# Patient Record
Sex: Male | Born: 1990 | Marital: Single
Health system: Northeastern US, Academic
[De-identification: ages and names within clinical notes are randomized; demographics above are authoritative.]

---

## 2013-11-28 ENCOUNTER — Encounter (HOSPITAL_COMMUNITY): Payer: Self-pay

## 2013-11-28 ENCOUNTER — Emergency Department (HOSPITAL_COMMUNITY)
Admission: EM | Admit: 2013-11-28 | Discharge: 2013-11-29 | Disposition: A | Discharge status: Home / Self Care | Payer: BC Managed Care – PPO | Attending: Emergency Medicine | Admitting: Emergency Medicine

## 2013-11-28 ENCOUNTER — Emergency Department (HOSPITAL_COMMUNITY): Payer: BC Managed Care – PPO

## 2013-11-28 DIAGNOSIS — R21 Rash and other nonspecific skin eruption: Secondary | ICD-10-CM | POA: Insufficient documentation

## 2013-11-28 DIAGNOSIS — M791 Myalgia, unspecified site: Secondary | ICD-10-CM

## 2013-11-28 DIAGNOSIS — R509 Fever, unspecified: Secondary | ICD-10-CM

## 2013-11-28 DIAGNOSIS — R0981 Nasal congestion: Secondary | ICD-10-CM | POA: Insufficient documentation

## 2013-11-28 DIAGNOSIS — J029 Acute pharyngitis, unspecified: Secondary | ICD-10-CM | POA: Diagnosis not present

## 2013-11-28 LAB — COMPREHENSIVE METABOLIC PANEL
ALT: 19 U/L (ref 0–53)
ANION GAP: 20 — AB (ref 5–15)
AST: 25 U/L (ref 0–37)
Albumin: 3.6 g/dL (ref 3.5–5.2)
Alkaline Phosphatase: 79 U/L (ref 39–117)
BILIRUBIN TOTAL: 1.7 mg/dL — AB (ref 0.3–1.2)
BUN: 14 mg/dL (ref 6–23)
CHLORIDE: 93 meq/L — AB (ref 96–112)
CO2: 21 meq/L (ref 19–32)
Calcium: 9.1 mg/dL (ref 8.4–10.5)
Creatinine, Ser: 1.08 mg/dL (ref 0.50–1.35)
GFR calc Af Amer: >90 mL/min (ref >90)
Glucose, Bld: 104 mg/dL — ABNORMAL HIGH (ref 70–99)
POTASSIUM: 3.8 meq/L (ref 3.7–5.3)
Sodium: 134 mEq/L — ABNORMAL LOW (ref 137–147)
Total Protein: 7.9 g/dL (ref 6.0–8.3)

## 2013-11-28 LAB — URINALYSIS, ROUTINE W REFLEX MICROSCOPIC
Glucose, UA: NEGATIVE mg/dL
Hgb urine dipstick: NEGATIVE
KETONES UR: 40 mg/dL — AB
NITRITE: NEGATIVE
PH: 6 (ref 5.0–8.0)
PROTEIN: 30 mg/dL — AB
Specific Gravity, Urine: 1.035 — ABNORMAL HIGH (ref 1.005–1.030)
Urobilinogen, UA: >8 mg/dL — ABNORMAL HIGH (ref 0.0–1.0)

## 2013-11-28 LAB — CBC WITH DIFFERENTIAL/PLATELET
BASOS PCT: 0 % (ref 0–1)
Basophils Absolute: 0.1 10*3/uL (ref 0.0–0.1)
Eosinophils Absolute: 0.1 10*3/uL (ref 0.0–0.7)
Eosinophils Relative: 1 % (ref 0–5)
HEMATOCRIT: 43.4 % (ref 39.0–52.0)
Hemoglobin: 15.2 g/dL (ref 13.0–17.0)
LYMPHS ABS: 1.6 10*3/uL (ref 0.7–4.0)
LYMPHS PCT: 8 % — AB (ref 12–46)
MCH: 28.3 pg (ref 26.0–34.0)
MCHC: 35 g/dL (ref 30.0–36.0)
MCV: 80.8 fL (ref 78.0–100.0)
MONO ABS: 1.3 10*3/uL — AB (ref 0.1–1.0)
MONOS PCT: 7 % (ref 3–12)
NEUTROS ABS: 16.4 10*3/uL — AB (ref 1.7–7.7)
NEUTROS PCT: 84 % — AB (ref 43–77)
Platelets: 260 10*3/uL (ref 150–400)
RBC: 5.37 MIL/uL (ref 4.22–5.81)
RDW: 11.9 % (ref 11.5–15.5)
WBC: 19.4 10*3/uL — AB (ref 4.0–10.5)

## 2013-11-28 LAB — URINE MICROSCOPIC-ADD ON

## 2013-11-28 LAB — RAPID STREP SCREEN (MED CTR MEBANE ONLY): STREPTOCOCCUS, GROUP A SCREEN (DIRECT): NEGATIVE

## 2013-11-28 LAB — SEDIMENTATION RATE: Sed Rate: 34 mm/hr — ABNORMAL HIGH (ref 0–16)

## 2013-11-28 LAB — I-STAT CG4 LACTIC ACID, ED: Lactic Acid, Venous: 1.98 mmol/L (ref 0.5–2.2)

## 2013-11-28 LAB — CK: Total CK: 351 U/L — ABNORMAL HIGH (ref 7–232)

## 2013-11-28 MED ORDER — SODIUM CHLORIDE 0.9 % IV BOLUS (SEPSIS)
1000.0000 mL | Freq: Once | INTRAVENOUS | Status: AC
  Administered 2013-11-28: 1000 mL via INTRAVENOUS

## 2013-11-28 MED ORDER — DEXTROSE 5 % IV SOLN
1.0000 g | Freq: Once | INTRAVENOUS | Status: AC
  Administered 2013-11-29: 1 g via INTRAVENOUS
  Filled 2013-11-28: qty 10

## 2013-11-28 MED ORDER — IBUPROFEN 800 MG PO TABS
800.0000 mg | ORAL_TABLET | Freq: Once | ORAL | Status: AC
  Administered 2013-11-28: 800 mg via ORAL
  Filled 2013-11-28: qty 1

## 2013-11-28 NOTE — ED Notes (Signed)
3rd fluid bolus started

## 2013-11-28 NOTE — ED Provider Notes (Signed)
CSN: [id number]     Arrival date & time 11/28/13  1911 History   First MD Initiated Contact with Patient 11/28/13 1950     Chief Complaint  Patient presents with  . Fever  . Arm Pain  . Leg Pain     (Consider location/radiation/quality/duration/timing/severity/associated sxs/prior Treatment) HPI Comments: Patient from [facility], no significant PMH -- presents with 3 days of fever into the 103F-104F range, nasal congestion, sore throat, severe myalgias in bilateral arms and bilateral legs. [facility] and referred to the emergency department. He is given Tylenol prior to arrival. Patient states no other treatments for his symptoms. Patient states that he had a headache at the onset of his symptoms on Saturday (3 days ago) but this is largely resolved. No photophobia or phonophobia. He has not had eye redness, runny nose, or cough. He has not had neck stiffness or neck pain. He has not had nausea, vomiting but has had a decreased appetite. Patient reports watery stools without blood over the past 24 hours. No urinary symptoms. Patient states that his hands appear red but denies other rash. No known sick contacts.   Patient is a 23 y.o. male presenting with fever, arm pain, and leg pain. The history is provided by the patient.  Fever Associated symptoms: chills, congestion, myalgias, rash (red hands) and sore throat   Associated symptoms: no chest pain, no confusion, no cough, no diarrhea, no dysuria, no ear pain, no headaches (resolved), no nausea, no rhinorrhea and no vomiting   Arm Pain Associated symptoms include chills, congestion, fatigue, a fever, myalgias, a rash (red hands) and a sore throat. Pertinent negatives include no abdominal pain, arthralgias, chest pain, coughing, headaches (resolved), joint swelling, nausea, neck pain, vomiting or weakness.  Leg Pain Associated symptoms: fatigue and fever   Associated symptoms: no back pain and no neck pain      History reviewed. No pertinent past medical history. History reviewed. No pertinent past surgical history. History reviewed. No pertinent family history. History  Substance Use Topics  . Smoking status: Passive Smoke Exposure - Never Smoker  . Smokeless tobacco: Not on file  . Alcohol Use: No    Review of Systems  Constitutional: Positive for fever, chills, appetite change and fatigue.  HENT: Positive for congestion and sore throat. Negative for ear discharge, ear pain, facial swelling, mouth sores and rhinorrhea.   Eyes: Negative for redness.  Respiratory: Negative for cough and shortness of breath.   Cardiovascular: Negative for chest pain.  Gastrointestinal: Negative for nausea, vomiting, abdominal pain and diarrhea.  Genitourinary: Negative for dysuria, frequency and hematuria.  Musculoskeletal: Positive for myalgias. Negative for back pain, joint swelling, arthralgias, neck pain and neck stiffness.  Skin: Positive for rash (red hands).  Neurological: Negative for dizziness, seizures, syncope, speech difficulty, weakness, light-headedness and headaches (resolved).  Psychiatric/Behavioral: Negative for confusion.      Allergies  Review of patient's allergies indicates no known allergies.  Home Medications   Prior to Admission medications   Not on File   There were no vitals taken for this visit. Physical Exam  Constitutional: He appears well-developed and well-nourished.  HENT:  Head: Normocephalic and atraumatic.  Right Ear: Tympanic membrane, external ear and ear canal normal.  Left Ear: Tympanic membrane, external ear and ear canal normal.  Nose: No mucosal edema or rhinorrhea.  Mouth/Throat: Posterior oropharyngeal erythema present. No oropharyngeal exudate, posterior oropharyngeal edema or tonsillar abscesses.  Eyes: Right eye exhibits  no discharge. Left eye exhibits no discharge.  Mild scleral injection, L>R  Neck: Trachea normal and normal range of motion.  Neck supple. No spinous process tenderness and no muscular tenderness present. No rigidity. Normal range of motion present. No Brudzinski's sign and no Kernig's sign noted.  No meningeal signs.  Cardiovascular: Regular rhythm and normal heart sounds.   No murmur heard. Mild tachycardia.  Pulmonary/Chest: Effort normal and breath sounds normal. No respiratory distress. He has no wheezes. He has no rales.  Abdominal: Soft. There is no tenderness. There is no rebound and no guarding.  Musculoskeletal: He exhibits tenderness (generalized extremity tenderness). He exhibits no edema.  Neurological: He is alert. No cranial nerve deficit. Coordination normal.  Patient with 5/5 strength of hands, wrists, elbows, shoulders bilaterally.   Patient with 5/5 strength ankles, knees, hips bilaterally.   He reports muscle pain with movement of his arms and legs.   Skin: Skin is warm and dry.  No distinctive rash on palms, soles, nailbeds.   Psychiatric: He has a normal mood and affect.  Nursing note and vitals reviewed.   ED Course  Procedures (including critical care time) Labs Review Labs Reviewed  CBC WITH DIFFERENTIAL - Abnormal; Notable for the following:    WBC 19.4 (*)    Neutrophils Relative % 84 (*)    Neutro Abs 16.4 (*)    Lymphocytes Relative 8 (*)    Monocytes Absolute 1.3 (*)    All other components within normal limits  COMPREHENSIVE METABOLIC PANEL - Abnormal; Notable for the following:    Sodium 134 (*)    Chloride 93 (*)    Glucose, Bld 104 (*)    Total Bilirubin 1.7 (*)    Anion gap 20 (*)    All other components within normal limits  URINALYSIS, ROUTINE W REFLEX MICROSCOPIC - Abnormal; Notable for the following:    Color, Urine AMBER (*)    Specific Gravity, Urine 1.035 (*)    Bilirubin Urine MODERATE (*)    Ketones, ur 40 (*)    Protein, ur 30 (*)    Urobilinogen, UA >8.0 (*)    Leukocytes, UA SMALL (*)    All other components within normal limits  CK - Abnormal;  Notable for the following:    Total CK 351 (*)    All other components within normal limits  SEDIMENTATION RATE - Abnormal; Notable for the following:    Sed Rate 34 (*)    All other components within normal limits  RAPID STREP SCREEN  URINE CULTURE  CULTURE, BLOOD (ROUTINE X 2)  CULTURE, BLOOD (ROUTINE X 2)  CULTURE, GROUP A STREP  URINE MICROSCOPIC-ADD ON  I-STAT CG4 LACTIC ACID, ED     Imaging Review Dg Chest 2 View  11/28/2013   CLINICAL DATA:  Fever, body aches.  EXAM: CHEST  2 VIEW  COMPARISON:  None.  FINDINGS: The heart size and mediastinal contours are within normal limits. Both lungs are clear. The visualized skeletal structures are unremarkable.  IMPRESSION: No active cardiopulmonary disease.   Electronically Signed   By: Annia Beltrew  Davis M.D.   On: 11/28/2013 22:58     EKG Interpretation None       8:08 PM Patient seen and examined. Work-up initiated. Medications ordered.   Vital signs reviewed and are as follows: BP 131/73 mmHg  Pulse 106  Temp(Src) 98.8 F (37.1 C) (Oral)  Resp 20  SpO2 100%  11:56 PM Patient was discussed previously with Dr. Donnald GarrePfeiffer who  has seen patient. CXR was ordered and is negative. Patient appears very well and will go home. Prior to discharge patient to be given 1g IV Rocephin.   MDM   Final diagnoses:  None   Patient with fever. Patient appears well, non-toxic, tolerating PO's. This is likely a viral illness -- but 1g given to treat for strep given sore throat in case strep test is false neg. Throat is fairly erythematous without sign of abscess.   Do not suspect otitis media as TM's appear normal.  Do not suspect PNA given clear lung sounds on exam, patient with no cough, negative CXR.  Do not suspect strep throat negative strep screen, but throat is erythematous. Do not suspect UTI given normal UA.  Do not suspect meningitis given no HA, meningeal signs on exam.  Do not suspect endo/peri/myocarditis given no risk factors, no  CP/SOB, no skin findings.  Do not suspect abdominal etiology given no abd pain on exam.   Supportive care indicated with pediatrician follow-up or return if worsening. No dangerous or life-threatening conditions suspected or identified by history, physical exam, and by work-up. No indications for hospitalization identified.       Mitchelle Goerner, PA-C 11/28/13 2359

## 2013-11-28 NOTE — ED Notes (Signed)
Pt presents with a fever, BUE and BLE pain and weakness. Pt seen [facility] at his [facility]. States he can move his limbs but with "great pain."

## 2013-11-28 NOTE — ED Notes (Signed)
Pt's fever here was 98.8 here but was recorded on his paper work that was sent from [facility] was 103.5. Pt denies receiving anything for a fever

## 2013-11-28 NOTE — ED Notes (Signed)
Josh, PA-C, at the bedside.  

## 2013-11-29 LAB — URINE CULTURE
COLONY COUNT: NO GROWTH
Culture: NO GROWTH
SPECIAL REQUESTS: NORMAL

## 2013-11-29 MED ORDER — ACETAMINOPHEN 500 MG PO TABS: 1000.0000 mg | ORAL_TABLET | Freq: Four times a day (QID) | ORAL | Status: AC | PRN

## 2013-11-29 MED ORDER — IBUPROFEN 600 MG PO TABS
600.0000 mg | ORAL_TABLET | Freq: Four times a day (QID) | ORAL | Status: AC | PRN
Start: 2013-11-29 — End: ?

## 2013-11-29 NOTE — Discharge Instructions (Signed)
Fever, Adult A fever is a higher than normal body temperature. In an adult, an oral temperature around 98.6 F (37 C) is considered normal. A temperature of 100.4 F (38 C) or higher is generally considered a fever. Mild or moderate fevers generally have no long-term effects and often do not require treatment. Extreme fever (greater than or equal to 106 F or 41.1 C) can cause seizures. The sweating that may occur with repeated or prolonged fever may cause dehydration. Elderly people can develop confusion during a fever. A measured temperature can vary with:  Age.  Time of day. Method of measurement (mouth, underarm, rectal, or ear). Muscle Pain Muscle pain (myalgia) may be caused by many things, including: Overuse or muscle strain, especially if you are not in shape. This is the most common cause of muscle pain. Injury. Bruises. Viruses, such as the flu. Infectious diseases. Fibromyalgia, which is a chronic condition that causes muscle tenderness, fatigue, and headache. Autoimmune diseases, including lupus. Certain drugs, including ACE inhibitors and statins. Muscle pain may be mild or severe. In most cases, the pain lasts only a short time and goes away without treatment. To diagnose the cause of your muscle pain, your health care provider will take your medical history. This means he or she will ask you when your muscle pain began and what has been happening. If you have not had muscle pain for very long, your health care provider may want to wait before doing much testing. If your muscle pain has lasted a long time, your health care provider may want to run tests right away. If your health care provider thinks your muscle pain may be caused by illness, you may need to have additional tests to rule out certain conditions.  Treatment for muscle pain depends on the cause. Home care is often enough to relieve muscle pain. Your health care provider may also prescribe anti-inflammatory  medicine. HOME CARE INSTRUCTIONS Watch your condition for any changes. The following actions may help to lessen any discomfort you are feeling: Only take over-the-counter or prescription medicines as directed by your health care provider. Apply ice to the sore muscle: Put ice in a plastic bag. Place a towel between your skin and the bag. Leave the ice on for 15-20 minutes, 3-4 times a day. You may alternate applying hot and cold packs to the muscle as directed by your health care provider. If overuse is causing your muscle pain, slow down your activities until the pain goes away. Remember that it is normal to feel some muscle pain after starting a workout program. Muscles that have not been used often will be sore at first. Do regular, gentle exercises if you are not usually active. Warm up before exercising to lower your risk of muscle pain. Do not continue working out if the pain is very bad. Bad pain could mean you have injured a muscle. SEEK MEDICAL CARE IF: Your muscle pain gets worse, and medicines do not help. You have muscle pain that lasts longer than 3 days. You have a rash or fever along with muscle pain. You have muscle pain after a tick bite. You have muscle pain while working out, even though you are in good physical condition. You have redness, soreness, or swelling along with muscle pain. You have muscle pain after starting a new medicine or changing the dose of a medicine. SEEK IMMEDIATE MEDICAL CARE IF: You have trouble breathing. You have trouble swallowing. You have muscle pain along with a stiff  neck, fever, and vomiting. You have severe muscle weakness or cannot move part of your body. MAKE SURE YOU:  Understand these instructions. Will watch your condition. Will get help right away if you are not doing well or get worse. Document Released: 12/04/2005 Document Revised: 01/17/2013 Document Reviewed: 11/08/2012 [facility] Patient Information 2015 [place].  This information is not intended to replace advice given to you by your health care provider. Make sure you discuss any questions you have with your health care provider.   The fever is confirmed by taking a temperature with a thermometer. Temperatures can be taken different ways. Some methods are accurate and some are not.  An oral temperature is used most commonly. Electronic thermometers are fast and accurate.  An ear temperature will only be accurate if the thermometer is positioned as recommended by the manufacturer.  A rectal temperature is accurate and done for those adults who have a condition where an oral temperature cannot be taken.  An underarm (axillary) temperature is not accurate and not recommended. Fever is a symptom, not a disease.  CAUSES   Infections commonly cause fever.  Some noninfectious causes for fever include:  Some arthritis conditions.  Some thyroid or adrenal gland conditions.  Some immune system conditions.  Some types of cancer.  A medicine reaction.  High doses of certain street drugs such as methamphetamine.  Dehydration.  Exposure to high outside or room temperatures.  Occasionally, the source of a fever cannot be determined. This is sometimes called a "fever of unknown origin" (FUO).  Some situations may lead to a temporary rise in body temperature that may go away on its own. Examples are:  Childbirth.  Surgery.  Intense exercise. HOME CARE INSTRUCTIONS   Take appropriate medicines for fever. Follow dosing instructions carefully. If you use acetaminophen to reduce the fever, be careful to avoid taking other medicines that also contain acetaminophen. Do not take aspirin for a fever if you are younger than age 21. There is an association with Reye's syndrome. Reye's syndrome is a rare but potentially deadly disease.  If an infection is present and antibiotics have been prescribed, take them as directed. Finish them even if you start  to feel better.  Rest as needed.  Maintain an adequate fluid intake. To prevent dehydration during an illness with prolonged or recurrent fever, you may need to drink extra fluid.Drink enough fluids to keep your urine clear or pale yellow.  Sponging or bathing with room temperature water may help reduce body temperature. Do not use ice water or alcohol sponge baths.  Dress comfortably, but do not over-bundle. SEEK MEDICAL CARE IF:   You are unable to keep fluids down.  You develop vomiting or diarrhea.  You are not feeling at least partly better after 3 days.  You develop new symptoms or problems. SEEK IMMEDIATE MEDICAL CARE IF:   You have shortness of breath or trouble breathing.  You develop excessive weakness.  You are dizzy or you faint.  You are extremely thirsty or you are making little or no urine.  You develop new pain that was not there before (such as in the head, neck, chest, back, or abdomen).  You have persistent vomiting and diarrhea for more than 1 to 2 days.  You develop a stiff neck or your eyes become sensitive to light.  You develop a skin rash.  You have a fever or persistent symptoms for more than 2 to 3 days.  You have a fever and  your symptoms suddenly get worse. MAKE SURE YOU:   Understand these instructions.  Will watch your condition.  Will get help right away if you are not doing well or get worse. Document Released: 07/08/2000 Document Revised: 05/29/2013 Document Reviewed: 11/13/2010 [facility] Patient Information 2015 [place], [place]. This information is not intended to replace advice given to you by your health care provider. Make sure you discuss any questions you have with your health care provider.

## 2013-11-30 LAB — CULTURE, GROUP A STREP

## 2013-12-05 LAB — CULTURE, BLOOD (ROUTINE X 2)
Culture: NO GROWTH
Culture: NO GROWTH

## 2015-08-30 IMAGING — CR DG CHEST 2V
2 series · 2 of 2 positions shown · non-contrast
Comparison: None.

CLINICAL DATA: Fever, body aches.

EXAM:
CHEST  2 VIEW

[w chest pa]
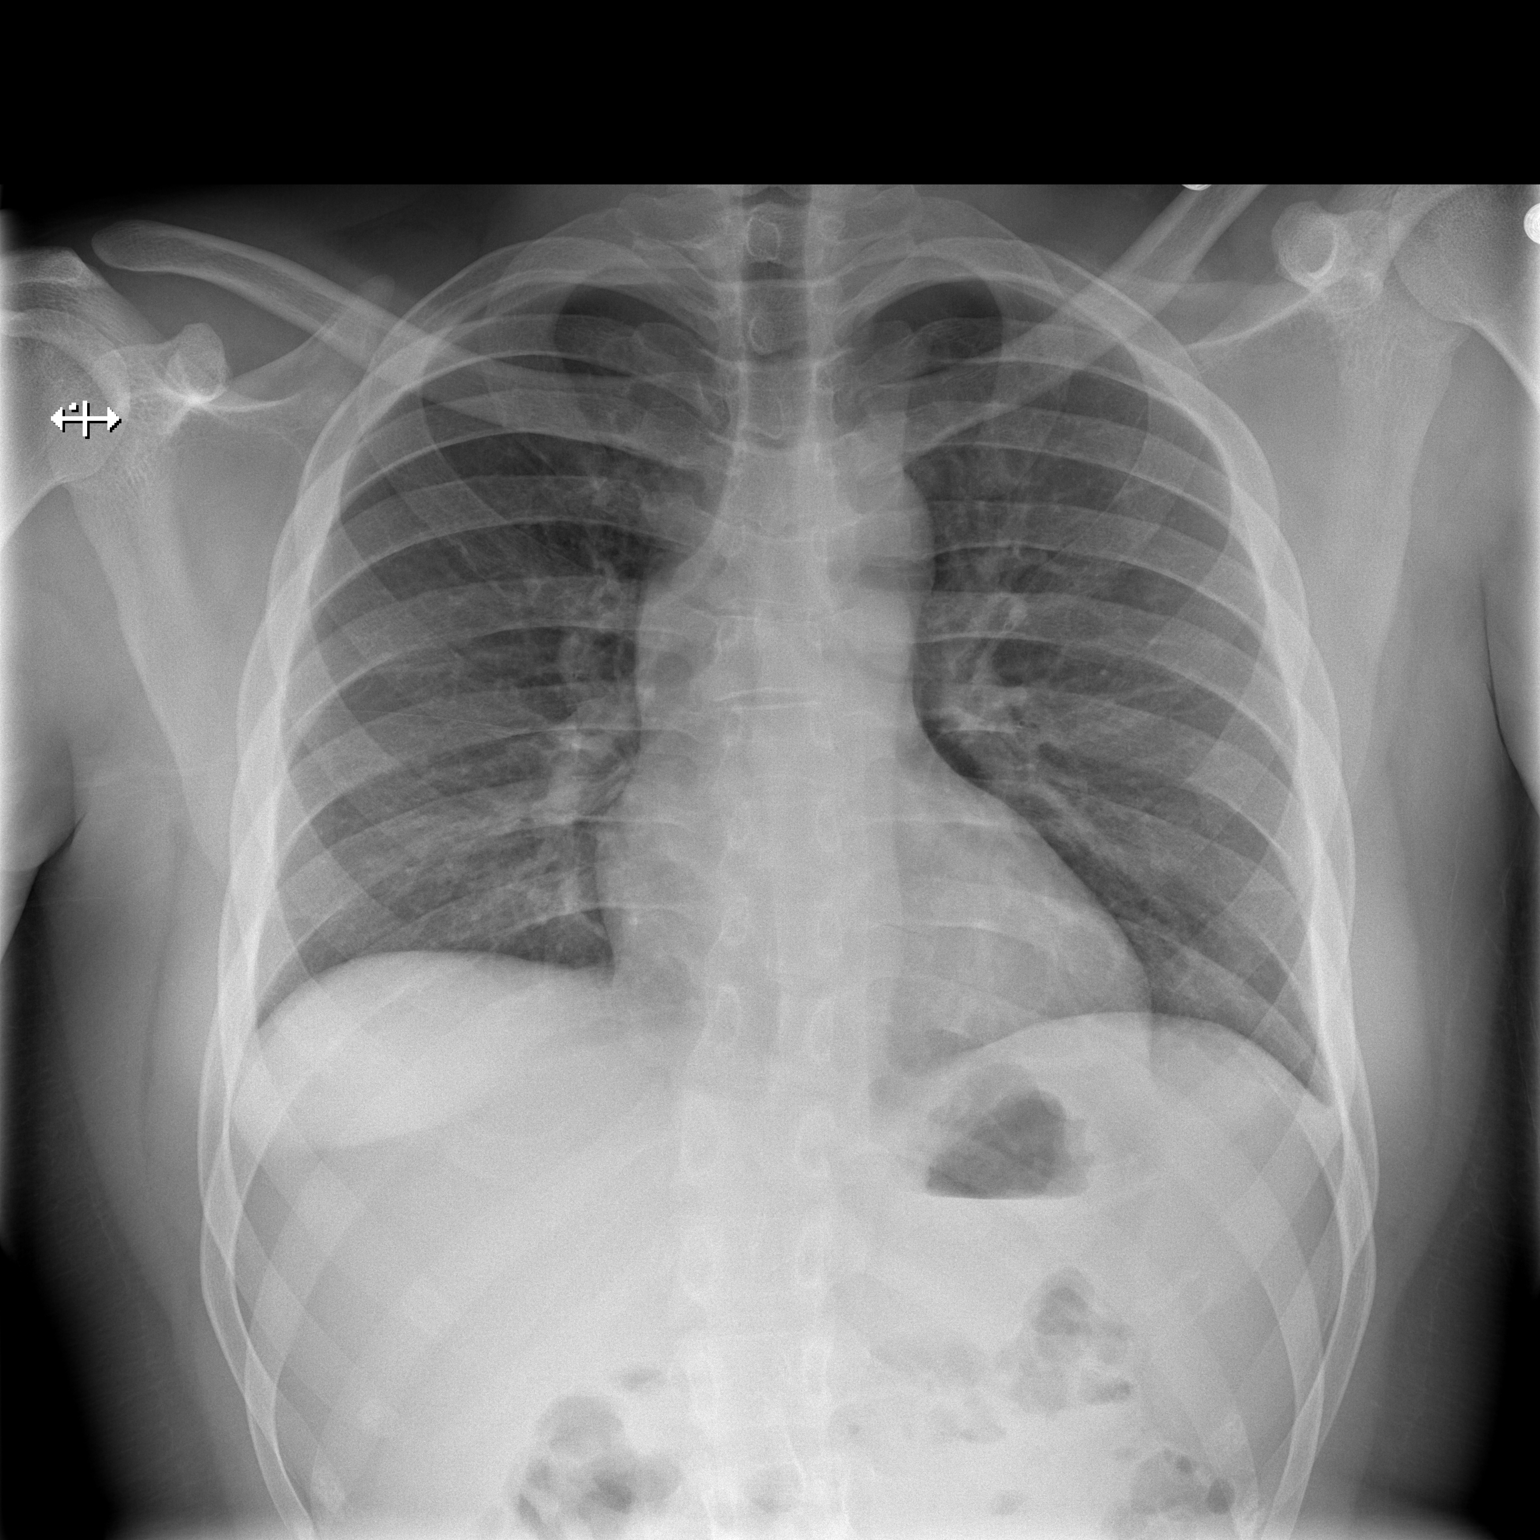

[w chest lat]
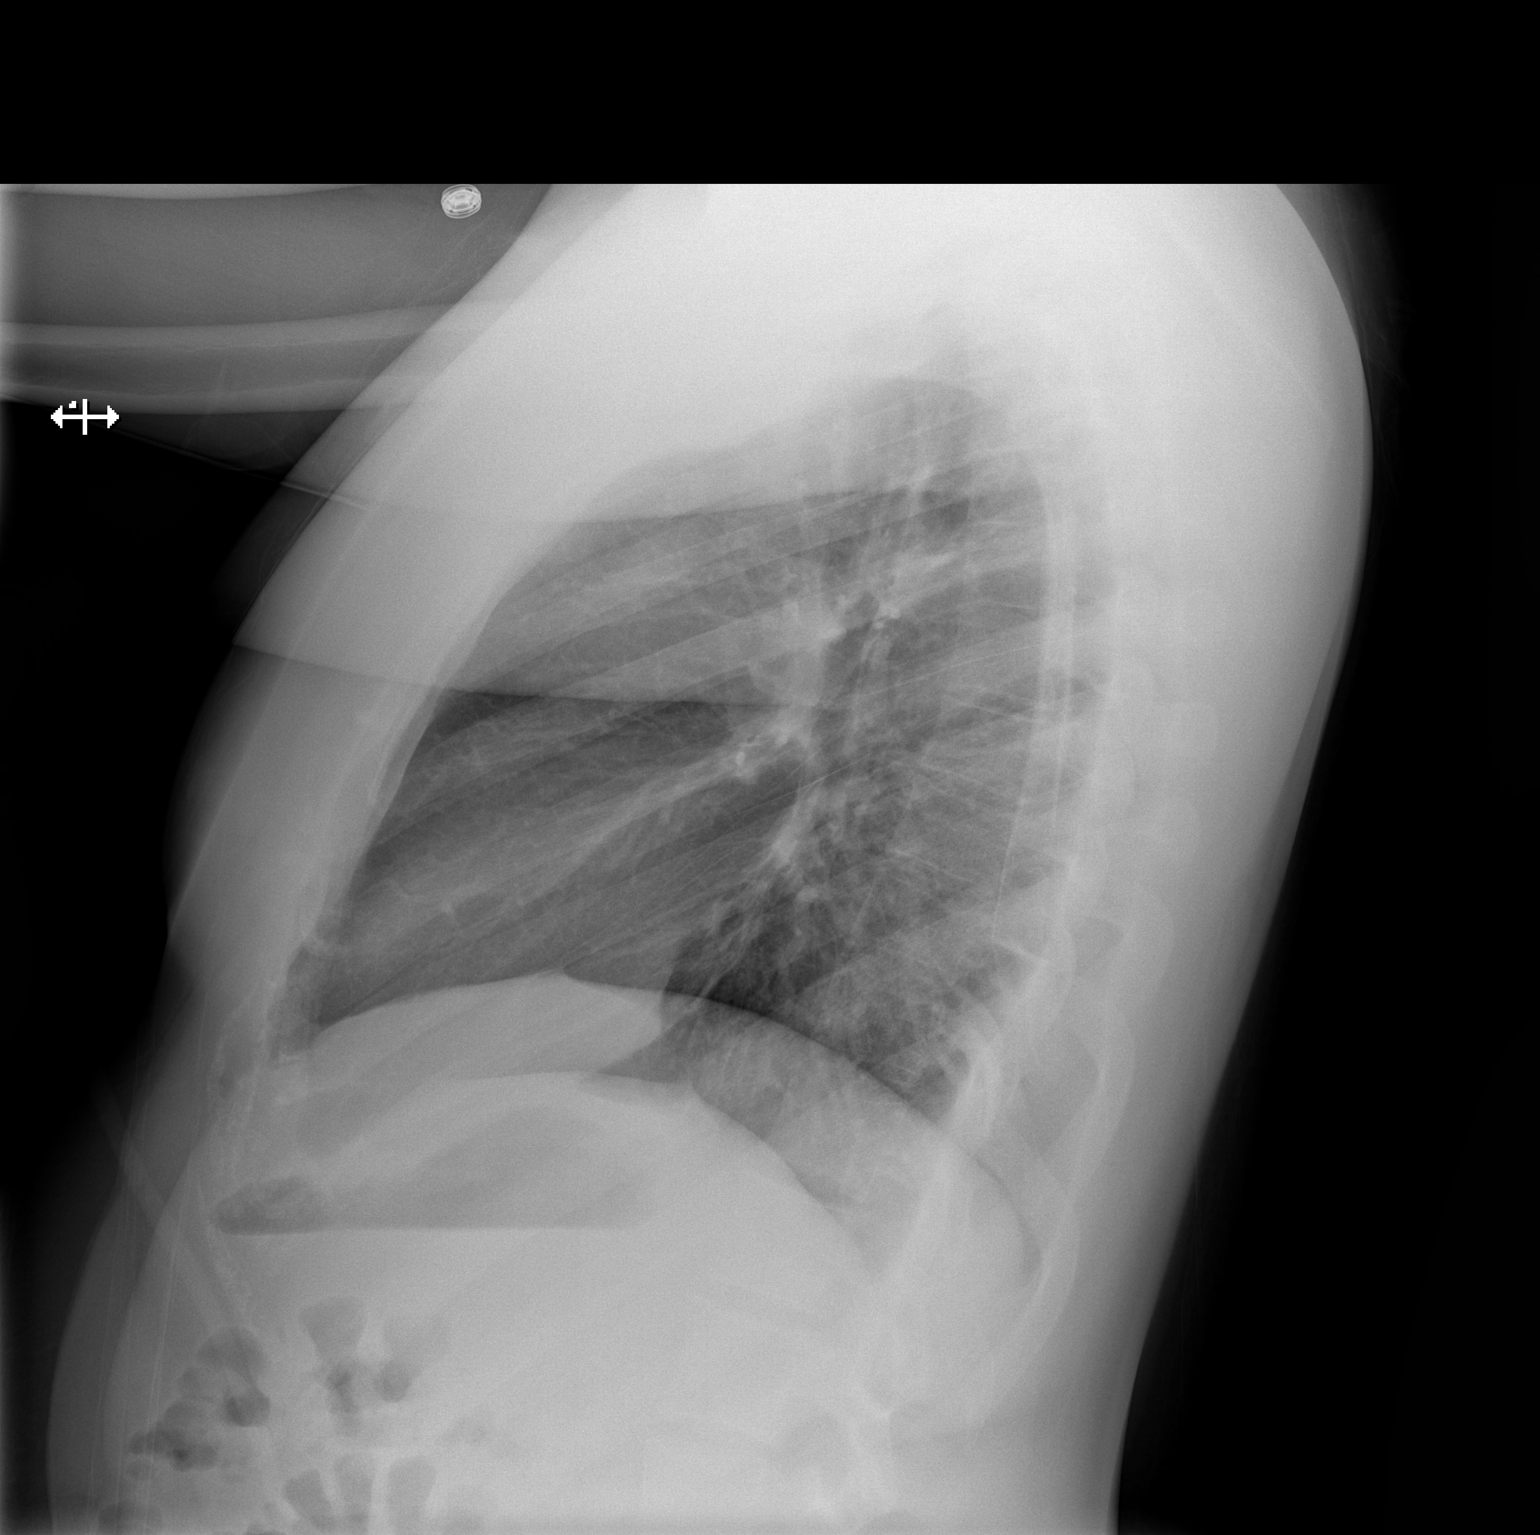

[2 of 2 positions shown; findings below may reference images not displayed]

FINDINGS: The heart size and mediastinal contours are within normal limits.
Both lungs are clear. The visualized skeletal structures are
unremarkable.
IMPRESSION: No active cardiopulmonary disease.

## 2017-01-15 ENCOUNTER — Inpatient Hospital Stay: Admission: RE | Admit: 2017-01-15 | Payer: Self-pay | Source: Ambulatory Visit

## 2017-03-26 NOTE — Anesthesia Preprocedure Evaluation (Addendum)
Anesthesia Pre-operative History and Physical for Francisco Donaldson    Highlighted Issues for this Procedure:  27 y.o. 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    Allergies:  No Known Allergies (drug, envir, food or latex)    Habitus:  - There is no height or weight on file to calculate BMI --> PCP note Jan 2019 shows BMI 28.05, pt is 6'0", 93.8kg    PMH:  - no contributing pmhx  - pt had negative cardiac work up per Feb 2019 cardiology note. Was worked up following illness following trip to [place] and subsequent imaging.  Cardiology notes ECHO to be normal with EF 55% and NO LVH and nml ECG.     Anesthetic history:  1).  No previous anesthetic records were available for comparison. No surgical hx reported         Note: I received verbal report from Dr. Clovis Riley, and I have reviewed this document. -Dr. Evonnie Dawes  CPM Summary:    I have reviewed the RN anesthesia screening information and erecords for [facility]. The patient is scheduled for LEFORT ORIF, Lefort I maxillary osteotomy (N/A Mouth)MANDIBLE RECONSTRUCTION, bilateral sagittal split osteotomies mandible (Bilateral Mouth)   GENIOPLASTY on 04/02/17.    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 No additional cardiopulmonary testing or in person anesthesia evaluation is indicated for this procedure.  CPM Chart Review onlyBy Jannetta Quint, NP at 10:17 AM on 03/26/2017    Anesthesia Evaluation Information Source: records, patient         GENERAL  Pertinent (-):  No obesity    HEENT    + Visual Impairment          corrective lens for ADL    + TMJD  Pertinent (-):  No  anatomic issues, sinus issues or nosebleeds PULMONARY     Denies pulmonary issues    CARDIOVASCULAR  Poor<4 METS Exercise Tolerance    + Cardiac Testing          more details above, 55% ejection fraction    GI/HEPATIC/RENAL  Last PO Intake: >8hr before procedure and >2hr before procedure (clears)  Pertinent(-):  No liver  issues or renal issues NEURO/PSYCH     Denies neuro/psych issues    ENDO/OTHER  Pertinent(-):  No diabetes mellitus, thyroid disease    HEMATOLOGIC     Denies hematologic issues       Physical Exam    Airway            Mouth opening: normal            Mallampati: II            TM distance (fb): >3 FB            TM distance (cm): 3            Neck ROM: full  Dental        Cardiovascular           Rhythm: regular           Rate: normal  Vascular Access            > 1 PIV  Pulmonary     breath sounds clear to auscultation    Mental Status     oriented to person, place and time         ________________________________________________________________________  PLAN  ASA Score  1  Anesthetic Plan general     Induction (routine IV) General Anesthesia/Sedation Maintenance Plan (inhaled agents);  Airway Manipulation (video laryngoscope); Airway (nasal RAE ETT); Line ( use current access); Monitoring (standard ASA); Positioning (supine); PONV Plan (dexamethasone and ondansetron); Pain (per surgical team); PostOp (PACU)    Informed Consent     Risks:          Risks discussed were commensurate with the plan listed above with the following specific points: N/V, aspiration, sore throat, hypotension, emergence delirium, dizziness, unsteadiness and fatigue , damage to:(eyes, nerves, teeth, blood vessels), allergic Rx, unexpected serious injury, awareness    Anesthetic Consent:         Anesthetic plan (and risks as noted above) were discussed with patient    Plan also discussed with team members including:       resident and attending    Attending Attestation:  As the primary attending anesthesiologist, I  attest that the patient or proxy understands and accepts the risks and benefits of the anesthesia plan. I also attest that I have personally performed a pre-anesthetic examination and evaluation, and prescribed the anesthetic plan for this particular location within 48 hours prior to the anesthetic as documented. Izell CarolinaAhmed R Khan, MD 1:20 PM

## 2017-04-02 ENCOUNTER — Encounter: Payer: Self-pay | Admitting: Anesthesiology

## 2017-04-02 ENCOUNTER — Encounter
Admission: RE | Disposition: A | Discharge status: Home / Self Care | Payer: Self-pay | Source: Ambulatory Visit | Attending: Oral Surgery

## 2017-04-02 ENCOUNTER — Inpatient Hospital Stay
Admission: RE | Admit: 2017-04-02 | Discharge: 2017-04-05 | Disposition: A | Discharge status: Home / Self Care | Payer: PRIVATE HEALTH INSURANCE | Source: Ambulatory Visit | Attending: Oral Surgery | Admitting: Oral Surgery

## 2017-04-02 ENCOUNTER — Inpatient Hospital Stay: Payer: PRIVATE HEALTH INSURANCE | Admitting: Nurse Practitioner

## 2017-04-02 DIAGNOSIS — M2602 Maxillary hypoplasia: Secondary | ICD-10-CM | POA: Insufficient documentation

## 2017-04-02 DIAGNOSIS — M2604 Mandibular hypoplasia: Secondary | ICD-10-CM

## 2017-04-02 HISTORY — PX: PR GENIOPLASTY SLIDING OSTEOTOMY SINGLE PIECE: 21121

## 2017-04-02 HISTORY — PX: PR RECONST MANDIBLE,SAG SPLIT+FIXATN: 21196

## 2017-04-02 HISTORY — PX: PR RCNSTJ MNDBLR RAMI&/BDY SGTL SPLT W/INT RGD FI: 21196

## 2017-04-02 HISTORY — PX: PR RECONST CHIN SLIDE SINGL OSTEOTOMY: 21121

## 2017-04-02 HISTORY — PX: PR RCNSTJ MIDFACE LEFORT I 3/> PIECE W/O BONE GRAFT: 21143

## 2017-04-02 HISTORY — PX: PR RECONST FACE,LEFORT I,3+ PIECES: 21143

## 2017-04-02 SURGERY — RECONSTRUCTION, MANDIBLE
Anesthesia: General | Site: Mouth | Wound class: Clean Contaminated

## 2017-04-02 MED ORDER — CHLORHEXIDINE GLUCONATE 0.12 % MT SOLN *I*
15.0000 mL | Freq: Once | OROMUCOSAL | Status: AC
Start: 2017-04-02 — End: 2017-04-02
  Administered 2017-04-02: 15 mL via OROMUCOSAL

## 2017-04-02 MED ORDER — LACTATED RINGERS IV SOLN *I*
20.0000 mL/h | INTRAVENOUS | Status: DC
Start: 2017-04-02 — End: 2017-04-02

## 2017-04-02 MED ORDER — FENTANYL CITRATE 50 MCG/ML IJ SOLN *WRAPPED*
INTRAMUSCULAR | Status: AC
Start: 2017-04-02 — End: 2017-04-02
  Filled 2017-04-02: qty 2

## 2017-04-02 MED ORDER — LIDOCAINE HCL 2 % IJ SOLN *I*
INTRAMUSCULAR | Status: DC | PRN
Start: 2017-04-02 — End: 2017-04-02
  Administered 2017-04-02: 100 mg via INTRAVENOUS

## 2017-04-02 MED ORDER — PROMETHAZINE HCL 25 MG/ML IJ SOLN *I*
6.2500 mg | Freq: Once | INTRAMUSCULAR | Status: DC | PRN
Start: 2017-04-02 — End: 2017-04-02

## 2017-04-02 MED ORDER — ACETAMINOPHEN 500 MG PO TABS *I*
ORAL_TABLET | ORAL | Status: AC
Start: 2017-04-02 — End: 2017-04-02
  Filled 2017-04-02: qty 2

## 2017-04-02 MED ORDER — PHENYLEPHRINE 100 MCG/ML IN NS 10 ML *WRAPPED*
INTRAMUSCULAR | Status: DC | PRN
Start: 2017-04-02 — End: 2017-04-02
  Administered 2017-04-02 (×4): 100 ug via INTRAVENOUS

## 2017-04-02 MED ORDER — OXYCODONE HCL 5 MG/5ML PO SOLN *I*
10.0000 mg | ORAL | Status: DC | PRN
Start: 2017-04-02 — End: 2017-04-04

## 2017-04-02 MED ORDER — HYDROMORPHONE HCL 2 MG/ML IJ SOLN *WRAPPED*
INTRAMUSCULAR | Status: AC
Start: 2017-04-02 — End: 2017-04-02
  Filled 2017-04-02: qty 1

## 2017-04-02 MED ORDER — HALOPERIDOL LACTATE 5 MG/ML IJ SOLN *I*
0.5000 mg | Freq: Once | INTRAMUSCULAR | Status: DC | PRN
Start: 2017-04-02 — End: 2017-04-02

## 2017-04-02 MED ORDER — FENTANYL CITRATE 50 MCG/ML IJ SOLN *WRAPPED*
INTRAMUSCULAR | Status: DC | PRN
Start: 2017-04-02 — End: 2017-04-02
  Administered 2017-04-02: 100 ug via INTRAVENOUS
  Administered 2017-04-02 (×3): 50 ug via INTRAVENOUS

## 2017-04-02 MED ORDER — BSS IO SOLN *I*
INTRAOCULAR | Status: DC | PRN
Start: 2017-04-02 — End: 2017-04-02
  Administered 2017-04-02: 15 mL via OPHTHALMIC

## 2017-04-02 MED ORDER — OXYMETAZOLINE HCL 0.05 % NA SOLN *I*
NASAL | Status: DC | PRN
Start: 2017-04-02 — End: 2017-04-02
  Administered 2017-04-02: 2 via NASAL

## 2017-04-02 MED ORDER — HYDROMORPHONE HCL 2 MG/ML IJ SOLN *WRAPPED*
INTRAMUSCULAR | Status: AC
Start: 2017-04-02 — End: 2017-04-02
  Administered 2017-04-02: 0.5 mg via INTRAVENOUS
  Filled 2017-04-02: qty 1

## 2017-04-02 MED ORDER — OXYCODONE HCL 5 MG/5ML PO SOLN *I*
5.0000 mg | ORAL | Status: DC | PRN
Start: 2017-04-02 — End: 2017-04-04

## 2017-04-02 MED ORDER — ACETAMINOPHEN 500 MG PO TABS *I*
1000.0000 mg | ORAL_TABLET | Freq: Once | ORAL | Status: AC
Start: 2017-04-02 — End: 2017-04-02
  Administered 2017-04-02: 1000 mg via ORAL

## 2017-04-02 MED ORDER — OXYCODONE HCL 5 MG/5ML PO SOLN *I*
5.0000 mg | Freq: Once | ORAL | Status: DC | PRN
Start: 2017-04-02 — End: 2017-04-02

## 2017-04-02 MED ORDER — ONDANSETRON HCL 2 MG/ML IV SOLN *I*
INTRAMUSCULAR | Status: DC | PRN
Start: 2017-04-02 — End: 2017-04-02
  Administered 2017-04-02: 4 mg via INTRAMUSCULAR

## 2017-04-02 MED ORDER — CEFAZOLIN 2000 MG IN STERILE WATER 20ML SYRINGE *I*
PREFILLED_SYRINGE | INTRAVENOUS | Status: AC
Start: 2017-04-02 — End: 2017-04-02
  Filled 2017-04-02: qty 20

## 2017-04-02 MED ORDER — HYDROMORPHONE HCL 2 MG/ML IJ SOLN *WRAPPED*
0.5000 mg | INTRAMUSCULAR | Status: DC | PRN
Start: 2017-04-02 — End: 2017-04-04
  Administered 2017-04-02: 0.5 mg via INTRAVENOUS

## 2017-04-02 MED ORDER — SUGAMMADEX SODIUM 100 MG/1ML IV SOLN *WRAPPED*
INTRAVENOUS | Status: AC
Start: 2017-04-02 — End: 2017-04-02
  Filled 2017-04-02: qty 2

## 2017-04-02 MED ORDER — PROPOFOL 10 MG/ML IV EMUL (INTERMITTENT DOSING) WRAPPED *I*
INTRAVENOUS | Status: DC | PRN
Start: 2017-04-02 — End: 2017-04-02
  Administered 2017-04-02: 100 mg via INTRAVENOUS
  Administered 2017-04-02: 200 mg via INTRAVENOUS

## 2017-04-02 MED ORDER — KETOROLAC TROMETHAMINE 30 MG/ML IJ SOLN *I*
INTRAMUSCULAR | Status: AC
Start: 2017-04-02 — End: 2017-04-02
  Filled 2017-04-02: qty 1

## 2017-04-02 MED ORDER — OXYMETAZOLINE HCL 0.05 % NA SOLN *I*
2.0000 | Freq: Two times a day (BID) | NASAL | Status: DC
Start: 2017-04-03 — End: 2017-04-05
  Administered 2017-04-03 – 2017-04-05 (×4): 2 via NASAL
  Filled 2017-04-02: qty 15

## 2017-04-02 MED ORDER — MIDAZOLAM HCL 1 MG/ML IJ SOLN *I* WRAPPED
INTRAMUSCULAR | Status: AC
Start: 2017-04-02 — End: 2017-04-02
  Filled 2017-04-02: qty 2

## 2017-04-02 MED ORDER — CEFAZOLIN 2000 MG IN STERILE WATER 20ML SYRINGE *I*
2000.0000 mg | PREFILLED_SYRINGE | Freq: Once | INTRAVENOUS | Status: AC
Start: 2017-04-02 — End: 2017-04-02
  Administered 2017-04-02: 2000 mg via INTRAVENOUS

## 2017-04-02 MED ORDER — CEFAZOLIN 2000 MG IN STERILE WATER 20ML SYRINGE *I*
2000.0000 mg | PREFILLED_SYRINGE | Freq: Three times a day (TID) | INTRAVENOUS | Status: DC
Start: 2017-04-02 — End: 2017-04-05
  Administered 2017-04-02 – 2017-04-05 (×8): 2000 mg via INTRAVENOUS
  Filled 2017-04-02 (×10): qty 20

## 2017-04-02 MED ORDER — CHLORHEXIDINE GLUCONATE 0.12 % MT SOLN *I*
OROMUCOSAL | Status: AC
Start: 2017-04-02 — End: 2017-04-02
  Filled 2017-04-02: qty 15

## 2017-04-02 MED ORDER — LIDOCAINE-EPINEPHRINE 1 %-1:100000 IJ SOLN *I*
INTRAMUSCULAR | Status: DC | PRN
Start: 2017-04-02 — End: 2017-04-02
  Administered 2017-04-02: 3 mL via SUBCUTANEOUS
  Administered 2017-04-02: 7 mL via SUBCUTANEOUS
  Administered 2017-04-02: 22 mL

## 2017-04-02 MED ORDER — CHLORHEXIDINE GLUCONATE 0.12 % MT SOLN *I*
15.0000 mL | Freq: Two times a day (BID) | OROMUCOSAL | Status: DC
Start: 2017-04-02 — End: 2017-04-05
  Administered 2017-04-02 – 2017-04-05 (×6): 15 mL via OROMUCOSAL
  Filled 2017-04-02 (×5): qty 15

## 2017-04-02 MED ORDER — HYDROMORPHONE HCL PF 1 MG/ML IJ SOLN *WRAPPED*
INTRAMUSCULAR | Status: DC | PRN
Start: 2017-04-02 — End: 2017-04-02
  Administered 2017-04-02: 0.5 mg via INTRAVENOUS

## 2017-04-02 MED ORDER — MIDAZOLAM HCL 1 MG/ML IJ SOLN *I* WRAPPED
INTRAMUSCULAR | Status: DC | PRN
Start: 2017-04-02 — End: 2017-04-02
  Administered 2017-04-02: 2 mg via INTRAVENOUS

## 2017-04-02 MED ORDER — PROMETHAZINE HCL 25 MG/ML IJ SOLN *I*
6.2500 mg | Freq: Four times a day (QID) | INTRAMUSCULAR | Status: DC | PRN
Start: 2017-04-02 — End: 2017-04-05

## 2017-04-02 MED ORDER — LIDOCAINE HCL (PF) 1 % IJ SOLN *I*
0.1000 mL | INTRAMUSCULAR | Status: DC | PRN
Start: 2017-04-02 — End: 2017-04-02

## 2017-04-02 MED ORDER — ONDANSETRON HCL 2 MG/ML IV SOLN *I*
4.0000 mg | Freq: Three times a day (TID) | INTRAMUSCULAR | Status: DC | PRN
Start: 2017-04-02 — End: 2017-04-05

## 2017-04-02 MED ORDER — ACETAMINOPHEN 325 MG/10.15 ML WRAPPED *I*
650.0000 mg | Freq: Four times a day (QID) | Status: DC
Start: 2017-04-02 — End: 2017-04-05
  Administered 2017-04-03 – 2017-04-05 (×10): 650 mg via ORAL
  Filled 2017-04-02 (×11): qty 20.3

## 2017-04-02 MED ORDER — SALINE NASAL SPRAY 0.65 % NA SOLN *WRAPPED*
2.0000 | NASAL | Status: DC | PRN
Start: 2017-04-02 — End: 2017-04-05
  Administered 2017-04-04: 2 via NASAL
  Filled 2017-04-02 (×2): qty 44

## 2017-04-02 MED ORDER — DEXAMETHASONE SODIUM PHOSPHATE 4 MG/ML INJ SOLN *WRAPPED*
INTRAMUSCULAR | Status: DC | PRN
Start: 2017-04-02 — End: 2017-04-02
  Administered 2017-04-02: 4 mg via INTRAVENOUS

## 2017-04-02 MED ORDER — BSS IO SOLN *I*
INTRAOCULAR | Status: AC
Start: 2017-04-02 — End: 2017-04-02
  Filled 2017-04-02: qty 15

## 2017-04-02 MED ORDER — HYDROMORPHONE HCL 2 MG/ML IJ SOLN *WRAPPED*
0.5000 mg | INTRAMUSCULAR | Status: AC | PRN
Start: 2017-04-02 — End: 2017-04-02
  Administered 2017-04-02: 0.5 mg via INTRAVENOUS

## 2017-04-02 MED ORDER — OXYCODONE HCL 5 MG/5ML PO SOLN *I*
10.0000 mg | Freq: Once | ORAL | Status: DC | PRN
Start: 2017-04-02 — End: 2017-04-02

## 2017-04-02 MED ORDER — ONDANSETRON HCL 2 MG/ML IV SOLN *I*
1.0000 mg | Freq: Once | INTRAMUSCULAR | Status: DC | PRN
Start: 2017-04-02 — End: 2017-04-02

## 2017-04-02 MED ORDER — ROCURONIUM BROMIDE 10 MG/ML IV SOLN *WRAPPED*
Status: DC | PRN
Start: 2017-04-02 — End: 2017-04-02
  Administered 2017-04-02: 20 mg via INTRAVENOUS
  Administered 2017-04-02 (×2): 10 mg via INTRAVENOUS
  Administered 2017-04-02: 20 mg via INTRAVENOUS
  Administered 2017-04-02: 10 mg via INTRAVENOUS
  Administered 2017-04-02: 60 mg via INTRAVENOUS
  Administered 2017-04-02: 20 mg via INTRAVENOUS

## 2017-04-02 MED ORDER — KETOROLAC TROMETHAMINE 30 MG/ML IJ SOLN *I*
30.0000 mg | Freq: Four times a day (QID) | INTRAMUSCULAR | Status: DC
Start: 2017-04-02 — End: 2017-04-04
  Administered 2017-04-02 – 2017-04-04 (×6): 30 mg via INTRAVENOUS
  Filled 2017-04-02 (×3): qty 1

## 2017-04-02 MED ORDER — SUGAMMADEX SODIUM 100 MG/1ML IV SOLN *WRAPPED*
INTRAVENOUS | Status: DC | PRN
Start: 2017-04-02 — End: 2017-04-02
  Administered 2017-04-02: 200 mg via INTRAVENOUS

## 2017-04-02 MED ORDER — ACETAMINOPHEN 650 MG/20.3 ML WRAPPED *I*
975.0000 mg | Freq: Once | Status: AC
Start: 2017-04-02 — End: 2017-04-02

## 2017-04-02 MED ORDER — LIDOCAINE-EPINEPHRINE 1 %-1:100000 IJ SOLN *I*
INTRAMUSCULAR | Status: AC
Start: 2017-04-02 — End: 2017-04-02
  Filled 2017-04-02: qty 50

## 2017-04-02 MED ORDER — DEXAMETHASONE SODIUM PHOSPHATE 4 MG/ML INJ SOLN *WRAPPED*
8.0000 mg | Freq: Two times a day (BID) | INTRAMUSCULAR | Status: DC
Start: 2017-04-02 — End: 2017-04-05
  Administered 2017-04-03 – 2017-04-05 (×6): 8 mg via INTRAVENOUS
  Filled 2017-04-02 (×7): qty 2

## 2017-04-02 MED ORDER — LACTATED RINGERS IV SOLN *I*
100.0000 mL/h | INTRAVENOUS | Status: DC
Start: 2017-04-02 — End: 2017-04-05
  Administered 2017-04-02 – 2017-04-04 (×5): 100 mL/h via INTRAVENOUS

## 2017-04-02 SURGICAL SUPPLY — 75 items
ADHESIVE MASTISOL 2/3CC VIAL (Dressing) ×3 IMPLANT
BAND DENT F/INTERMAXILLARY FIX HVY 1/8IN E (Supply) ×2 IMPLANT
BIT 1.5MM TROCAR DRILL 100MM/18MM (Supply) ×3 IMPLANT
BIT DRILL COUPLING QUICK CALIBRATED 100MM (Supply) ×3 IMPLANT
BIT MATRX 1.4MM DRLL J-LTCH 6MM STP/44.5 (Supply) ×3 IMPLANT
BLADE MINI RECIP THICK SHT .025 14 X 5MM (Supply) ×6 IMPLANT
BLADE RECIP 14.5X.38MM PRECISE (Supply) ×6 IMPLANT
BLADE RECIP TAPER 22.5MM (Supply) ×6 IMPLANT
BLADE SAFE-EDGE LG 23.00MM (Supply) IMPLANT
BLADE SAG MICRO 4.5 X 25.5 X .38MM (Supply) ×3 IMPLANT
BLADE SHORT WIDE 18.5X16.5MM (Supply) IMPLANT
BUR CARBIDE FISSURE CROSS CUT 1.6MM (Other) ×3 IMPLANT
BUR CARBIDE OVAL 4MM (Other) ×3 IMPLANT
BUR DENTAL CARBIDE OVAL 4MM LONG OVAL (Other) ×2 IMPLANT
BUR FISSURE CROSS CUT 2.1MM ROUND (Other) ×6 IMPLANT
CATH URETH RUBBER 14FR LTX RED (Supply) ×3 IMPLANT
COVER MAYO STAND (Drape)
COVER MAYO STD W24XL53IN 3 LAYR SMS RECOIL TECH DISP (Drape) IMPLANT
DRAPE SHEET 40X70 MED (Drape) ×6 IMPLANT
DRESSING FLUFF SUPER SP 6 X 6.75IN STER (Dressing) ×3 IMPLANT
DRESSING TEGADERM W/LABEL 2 3/8 X 2 3/4 (Dressing) ×6 IMPLANT
ELASTIC DENTAL XTR (Supply) ×1
ELECTRODE BLADE MODIFIED 2.5IN (Supply) ×3 IMPLANT
FILTER NEPTUNE 4PORT MANIFOLD (Supply) ×3 IMPLANT
GLOVE BIOGEL PI ORTHOPRO SZ 7.5 LF (Glove) ×3 IMPLANT
GLOVE SURG BIOGEL PI ULTRATOUCH SZ 8.0 (Glove) ×3 IMPLANT
GLOVE SURG ULTAFREE MAX SZ7.5 PF LTX (Glove) ×3 IMPLANT
HANDLE LIGHT STERILE (Supply) ×3 IMPLANT
HANDLE LITE EZ RIGID NL (Supply) ×3 IMPLANT
OMFS CUST EAR NOSE AND THRT SUR TY (Pack) ×2 IMPLANT
PACK CUSTOM OMFS (Pack) ×1
PACK TOWEL LIGHT BLUE STERILE (Pack) ×3 IMPLANT
PACKS SURGICAL TOWEL (Supply) ×3 IMPLANT
PAD PREP ALCOHOL 2PLY MED (Supply) ×3 IMPLANT
PATTIES SURG 1 X 3IN (Sponge) ×3 IMPLANT
PENCIL #2 LONG STR (Other) ×1
PENCIL WOODEN NO2 PRESHARPENED SFT (Other) ×2 IMPLANT
PLATE ADAPTION 20H 0.8MM THICK (Plate) ×3 IMPLANT
PLATE BNE 100DEG 2X3 H L CMF G TI L SHP OBLQ RIG FOR 1.5MM SCR (Plate) ×6 IMPLANT
PLATE BNE 100DEG 2X3 H R CMF G TI L SHP OBLQ RIG FOR 1.5MM SCR (Plate) ×6 IMPLANT
PLATE TI MATRIX CHIN DOUB BEND 5 H/6MM (Plate) ×3 IMPLANT
PROTECTOR ULNA NERVE ~~LOC~~ (Supply) ×3 IMPLANT
SCISSORS WIRE CUT 4.75IN DISP (Other) IMPLANT
SCREW 1.85MM COURSE PITCH SELF TAP 12MM (Screw) ×3 IMPLANT
SCREW 1.85MM COURSE PITCH SELF TAP 14MM (Screw) ×3 IMPLANT
SCREW 1.85MM COURSE PITCH SELF TAP 16MM (Screw) ×12 IMPLANT
SCREW 1.85MM COURSE PITCH SELF TAP 18MM (Screw) ×9 IMPLANT
SCREW BNE AD PED L8MM DIA1.85MM CORT TI ST NON CANNU NONLOCK FT MATRIXORTHOGNATHIC (Screw) ×3 IMPLANT
SCREW BNE L5MM DIA1.85MM TI ST MATRIXORTHOGNATHIC 5 PER PK (Screw) ×78 IMPLANT
SCREW BNE L6MM DIA1.85MM TI ST MATRIXORTHOGNATHIC 5 PER PK (Screw) ×15 IMPLANT
SOL H2O IRRIG 500ML STERILE BTL (Solution) ×2 IMPLANT
SOL H2O IRRIG STER 500ML BTL (Solution) ×3
SOL SOD CHL IRRIG 500ML BTL (Solution) ×3 IMPLANT
SOL SODIUM CHLORIDE IRRIG 1000ML BTL (Solution) ×3 IMPLANT
SPIKE MINI DISPENSING PIN (BBRAUN DP1000) (Supply) ×3 IMPLANT
SPONGE SURGICEL ABS 4 X 8IN (Sponge) ×3 IMPLANT
SPONGE SURGICEL FIBRILLAR ABS 4X4IN (Sponge) ×3 IMPLANT
STAPLER SKIN 35W NL (Supply) ×1
STAPLER SKIN WIDE STPL LEG L3.9MM WIRE DIA0.58MM FIX HD PROX (Supply) ×2 IMPLANT
SUPPORT FACIOPLASTY ELASTIC MD (Supply) ×3 IMPLANT
SUTR CHROMIC GUT 3-0 PS-2 27IN (Suture) ×18 IMPLANT
SUTR ETHIBOND 0 30IN CT-1 GREEN (Suture) ×3 IMPLANT
SUTR PROLENE 5-0 PC-1 MONO 18IN (Suture) ×3 IMPLANT
SUTR PROLENE MONO 5-0 PS-3 BLUE (Suture) ×3 IMPLANT
SUTR SILK 6-0 P-3 18IN BLACK (Suture) ×3 IMPLANT
SUTR VICRYL 4-0 RB-1 27IN UNDY (Suture) IMPLANT
SUTR VICRYL ANTIB 3-0 RB-1 27 VIOLET (Suture) ×3 IMPLANT
SUTR VICRYL ANTIB 3-0 SH 27 UNDY (Suture) ×3 IMPLANT
SYRINGE LUERLOCK 20ML INDIVIDUAL WRAP (Syringe) ×1
SYRINGE LUERLOCK 20ML INDIVIDUAL WRAPPED (Syringe) ×2 IMPLANT
SYRINGE LUERLOCK 30ML INDIVIDUAL WRAP (Syringe) ×3 IMPLANT
TRAP NEPTUNE SPEC (Supply) ×1
TRAP SPEC IN LN FLTR FOR NEPTUNE FLD WST MGMT SYS QUIK TRAP (Supply) ×2 IMPLANT
TRAY FOLEY SIL DRAIN BAG 14FR (Tray) ×3 IMPLANT
TRAY FOLEY SIL PREC400 PREM U/M 14FR (Other) ×3 IMPLANT

## 2017-04-02 NOTE — Anesthesia Case Conclusion (Signed)
CASE CONCLUSION  Emergence  Actions:  Suctioned and extubated  Criteria Used for Airway Removal:  Adequate Tv & RR, acceptable O2 saturation, 5 sec head lift, following commands and strong hand grip  Assessment:  Routine  Transport  Directly to: PACU  Airway:  Facemask  Oxygen Delivery:  6 lpm  Position:  Recumbent  Patient Condition on Handoff  Level of Consciousness:  Mildly sedated  Patient Condition:  Stable  Handoff Report to:  RN

## 2017-04-02 NOTE — Anesthesia Postprocedure Evaluation (Signed)
Anesthesia Post-Op Note    Patient: Francisco BeachJoshua Lingle    Procedure(s) Performed:  Procedure Summary  Date:  04/02/2017 Anesthesia Start: 04/02/2017  1:44 PM Anesthesia Stop: 04/02/2017  6:29 PM Room / Location:  [id number] / [facility] MAIN OR   Procedure(s):  MANDIBLE RECONSTRUCTION, sagittal split osteotomies mandible  GENIOPLASTY  MAXILLA RECONSTRUCTION Diagnosis:  Acquired transverse maxillary hypoplasia [M26.02]  Congenital hypoplasia of mandible [M26.04] Surgeon(s):  Thayer DallasFantuzzo, Joseph, MD  Olene FlossBurchfield, Charles, DDS  Vivianne MasterLookabaugh, Lujean AmelSarah Ashley, MD Attending Anesthesiologist:  Izell CarolinaKHAN, AHMED R, MD         Recovery Vitals  BP: 157/83 (04/02/2017  7:00 PM)  Heart Rate: 66 (04/02/2017  7:00 PM)  Heart Rate (via Pulse Ox): 74 (04/02/2017  7:00 PM)  Resp: 23 (04/02/2017  7:00 PM)  Temp: 36.2 C (97.2 F) (04/02/2017  7:00 PM)  SpO2: 100 % (04/02/2017  7:00 PM)  FiO2: 40 % (04/02/2017  7:00 PM)  O2 Flow Rate: 10 L/min (04/02/2017  7:00 PM)  Anesthesia type:  General  Complications Noted During Procedure or in PACU:  None   Comment:    Patient Location:  PACU  Level of Consciousness:    Recovered to baseline  Patient Participation:     Able to participate  Temperature Status:    Normothermic  Oxygen Saturation:    Within patient's normal range  Cardiac Status:   Within patient's normal range  Fluid Status:    Stable  Airway Patency:     Yes  Pulmonary Status:    Baseline  Pain Management:    Adequate analgesia  Nausea and Vomiting:  None    Post Op Assessment:    Tolerated procedure well   Attending Attestation:  All indicated post anesthesia care provided     -

## 2017-04-02 NOTE — Progress Notes (Signed)
UPDATES TO PATIENT'S CONDITION on the DAY OF SURGERY/PROCEDURE    I. Updates to Patient's Condition (to be completed by a provider privileged to complete a H&P, following reassessment of the patient by the provider):    Day of Surgery/Procedure Update:  History  History reviewed and no change    Physical  Physical exam updated and no change            II. Procedure Readiness   I have reviewed the patient's H&P and updated condition. By completing and signing this form, I attest that this patient is ready for surgery/procedure.    III. Attestation   I have reviewed the updated information regarding the patient's condition and it is appropriate to proceed with the planned surgery/procedure.    Thayer DallasJOSEPH Yemariam Magar, MD as of 12:49 PM 04/02/2017

## 2017-04-02 NOTE — INTERIM OP NOTE (Signed)
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]       Procedure(s) Performed: Procedure:    LEFORT ORIF, Lefort I maxillary osteotomy  CPT(R) Code:  21143 - PR RECONST FACE,LEFORT I, 3 PIECES    Procedure:    MANDIBLE RECONSTRUCTION, sagittal split osteotomies mandible  CPT(R) Code:  21196 - PR RECONST MANDIBLE,SAG SPLIT+FIXATN    Procedure:    GENIOPLASTY  CPT(R) Code:  21121 - PR RECONST CHIN SLIDE SINGL OSTEOTOMY         Additional CPT Codes:        Anesthesia Type: General        Fluid Totals: I/O this shift:  03/08 1500 - 03/08 2259  In: 1000 [I.V.:1000]  Out: -   Net: 1000       Estimated Blood Loss: * No values recorded between 04/02/2017  1:44 PM and 04/02/2017  6:12 PM *       Specimens to Pathology:  * No specimens in log *       Temporary Implants:        Packing:                 Patient Condition: good       Findings (Including unexpected complications): None unexpected     Signed:  Thayer DallasJOSEPH Madalin Hughart, MD  on 04/02/2017 at 6:12 PM

## 2017-04-02 NOTE — Op Note (Signed)
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]       Procedure(s) Performed: Procedure:    MANDIBLE RECONSTRUCTION, sagittal split osteotomies mandible  CPT(R) Code:  21196 - PR RECONST MANDIBLE,SAG SPLIT+FIXATN    Procedure:    GENIOPLASTY  CPT(R) Code:  21121 - PR RECONST CHIN SLIDE SINGL OSTEOTOMY    Procedure:    MAXILLA RECONSTRUCTION  CPT(R) Code:  70350 - PR RECONST FACE,LEFORT I,3+ PIECES         Additional CPT Codes:        Anesthesia Type: General        Fluid Totals: I/O this shift:  03/08 1500 - 03/08 2259  In: 1400 [I.V.:1400]  Out: 200 [Blood:200]  Net: 1200       Estimated Blood Loss: 200 mL       Specimens to Pathology:  * No specimens in log *       Temporary Implants:        Packing:                 Patient Condition: good       Indications: Patient is a 26 year old male with long-standing dentofacial deformity who was referred to the office of Dr. Marlou Starks for orthognathic surgery evaluation. The patient underwent pre-surgical orthodontics and was determined to be ready for surgery from a dental standpoint. Virtual surgical planning was completed, and a final plan of three piece Eddie Dibbles I osteotomy, BSSO, and genioplasty was decided upon, and the patient was scheduled for the procedure in the OR.       Findings (Including unexpected complications): No unexpected findings. Synthes plating system.     Description of Procedure:     On the day of the procedure, the patient and family  were met in the preanesthesia. All risks, benefits, and alternatives were reviewed with the patient and verbal and written consent was obtained. The history and physical was updated. The patient was transported to the operating room, placed supinely on the operating room table, general anesthesia was induced, and a nasal endotracheal tube was inserted. The patient was turned over to the surgical team where ET tube was secured with a suture to the septum. The eyelids were sutured closed. The patient was prepped and draped in sterile fashion. A surgical pause was performed, including all members of the surgical team, operating room staff, and anesthesia team. The patient was infiltrated with a total of a total of approximately 15cc 2% lidocaine with 1:100,000 epinephrine via bilateral inferior alveolar nerve blocks, long buccal nerve blocks, greater palatine nerve blocks, mental blocks, nasopalatine block, as well as local infiltration. External measurements were recorded from the central incisors to the medial canthi bilaterally.    Attention was thendirected to the mandible. A medium-sized rubber mouth prop was placed between the  maxillary and mandibular molars on the rightside to maintain mouth opening. Attention was then turned to the lateral vestibule of the left mandible where an incision was made with the Bovie electrocautery in the depth of vestibule adjacent to the second molar, extending anteriorly and posteriorly for a length of approximately4cm. The incision was carried down to bone. Subperiosteal dissection was used to expose the lateral mandible from the ramus to mid body down to the inferior border of the mandible. The anterior-superior aspect of the ramus was exposed toward the coronoid process with stripping of the temporalis muscle tendon as needed. The medial aspect of the ramus of the mandible inferior to the sigmoid notch and superior to the mandibular canal was dissected in a subperiosteal  plane. Retraction of the soft tissue was performed using a medial border retractor on the medial aspect. A reciprocating saw was used to complete the cortical osteotomies horizontally through the medial cortex of the ramus just above the mandibular canal and anteriorly down the lateral oblique line. The saw was then repositioned to complete the cortical osteotomy from the inferior border of the mandible vertically to meet up with the cortical osteotomy previously completed along the anterior aspect of the lateral oblique line. The same procedure was then completed on the other side.    Attention was then turned to the maxilla where a circumvestibular incision was made from the zygomatic buttress anteriorly toward the midline bilaterally. This incision was made with the Bovie electrocautery initially through mucosa and then directed down to bone through periosteum. A periosteal elevator was used to skeletonize the maxilla superiorly to the level of the infraorbital nerve and posteriorly to the area of the pterygomaxillary suture. Periosteal elevators were also used to separate the nasal mucosa from the floor of the nose and the cartilaginous septum from the anterior nasal spine. Using 2 toe-in and 2 toe-out Langenbach retractors, the maxilla was exposed and a sterile pencilwas used to mark the planned three piece Eddie Dibbles I osteotomy. The [employer] I osteotomy was then completed using a reciprocating saw under irrigation without complication. The interdental osteotomies were completed between the lateral incisor and canine bilaterally using an oscillating saw. A double-guarded osteotome wasthen used to separate the cartilaginous and bony septum from the maxilla.A curved osteotome was then used to separate the pterygoid plate from the maxilla. Thedown fracture was taken to completion using Rowe disimpaction forceps. Resection of bony spurs and deviated aspects of the maxilla was accomplished using a rongeur. The  nasal mucosa was then closed using 3-0 vicryl sutures. A reciprocating saw was then used to make the bilateral parasaggital osteotomies, which were connected to the interdental osteotomies. The three-piece Eddie Dibbles I osteotomies were then completed using a brown handle osteotome with free mobility of the three segments.    An intermediatesplint was then placed and secured to the maxillary dentition using 25g straight wires, and the patient was put into intermaxillary fixation using 25g loop wire. With the condyle seated in terminal hinged position, the decided-upon vertical dimension of the maxilla was achieved. Four appropriately sized SynthesL-shaped titanium plates were then placed, one on each zygomatic buttress and one on each piriform aperture, and they were secured with titanium screws. IMF was released and occlusion into the intermediate splint was confirmed.    Small and medium-sized osteotomeswerethen used to initiate the sagittal split of the mandible bilaterally. Parasagittal split spreader forceps were then utilized to complete the separation. The inferior alveolar neurovascular bundles were visualized bilaterally  and noted to be grossly intact. The final splint was secure to the maxillary dentition using 25g straight wires. The maxilla and mandible were placed into intermaxillary fixation into the final splint. Bony interferences were removed using a combination of Rongeur and pineapple bur. The inferior border was confirmed to be aligned bilaterally. With the proximal segmentsseated into the fossa, bicortical screw fixation was achieved bilaterally. A small greenstick fracture of the right buccal plate was noticed, and a monocortical adaption plate was secured on the facial aspect of the mandible using monocortical screws in this region for improved stability. The patient was then taken out of intermaxillary fixation and the occlusion was noted to be stable and reproducible into the  splint.    Attention was then directed to the anterior mandible where a vestibular genioplasty incision was made in the region between the mandibular canines. The mental nerves were identified and protected bilaterally. The incision was taken down to bone where a subperiosteal dissection was then completed to skeletonize the bony chin. The mental foramina were identified bilaterally and protected. The pre-fabricated cutting guide was placed and a sterile pencil used to mark the pre-planned genioplasty osteotomy. The midline was marked using an oscillating saw. The genioplasty osteotomy was then completed using a reciprocating saw under copious irrigation. An 64m screw was then placed in the osteotomized segment to facilitate anterior movement of the chin. A pre-bent 655mgenioplasty plate was then placed and secured using 6m58mcrews with good adapatation. The 8mm31mrew placed previously was removed.    All wounds were irrigated copiously with saline. The mentalis was closed using 3-0 vicryl sutures.Allintraoral wounds were thenclosed using 3-0 chromic gut suture. The oral cavity was irrigated and suctioned. The stomach was decompressed using OG suction. The throat pack was removed. Heavy elastic IMF was placed. The care of the patient was turned back over to anesthesia team, who extubated the patient and transported himto recovery in satisfactory condition.     Dr. FantKarlene Linemansent for all key portions of the procedure.      Signed:  CharSaddie BendersS  on 04/02/2017 at 6:34 PM

## 2017-04-02 NOTE — Anesthesia Procedure Notes (Signed)
---------------------------------------------------------------------------------------------------------------------------------------    AIRWAY   GENERAL INFORMATION AND STAFF    Patient location during procedure: OR       Date of Procedure: 04/02/2017 2:56 PM  CONDITION PRIOR TO MANIPULATION     Current Airway/Neck Condition:  Normal        For more airway physical exam details, see Anesthesia PreOp Evaluation  AIRWAY METHOD     Patient Position:  Sniffing    Preoxygenated: yes      Induction: IV    Mask Difficulty Assessment:  1 - vent by mask      Mask NMB: 1 - vent by mask      Technique Used for Successful ETT Placement:  Video laryngoscopy    Blade Type:  Macintosh    Laryngoscope Blade/Video laryngoscope Blade Size:  3    Cormack-Lehane Classification:  Grade I - full view of glottis    Placement Verified by: capnometry, auscultation, palpation of cuff and equal breath sounds      Number of Attempts at Approach:  1  FINAL AIRWAY DETAILS    Final Airway Type:  Endotracheal airway    Final Endotracheal Airway:  Nasal RAE      Cuffed: cuffed    Insertion Site:  Right naris    ETT Size (mm):  8.0    Distance inserted from Nares (cm):  28  ----------------------------------------------------------------------------------------------------------------------------------------

## 2017-04-02 NOTE — Progress Notes (Signed)
Oral and Maxillofacial Surgery Post Procedure Note    PROCEDURE  POD 0 from three piece [facility] [place] osteotomy, Francisco Donaldson, and genioplasty.    SUBJECTIVE  Pain well controlled. Denies fever, chills, nausea, vomiting, chest pain, shortness of breath. Minimal bleeding from right nares post op; controlled with Afrin soaked cotton roll. NAE reported.    OBJECTIVE  Physical Exam:  Patient Vitals for the past 4 hrs:   BP Temp Temp src Pulse Resp SpO2   04/02/17 2115 156/70 - - 61 22 97 %   04/02/17 2100 151/88 36.4 C (97.5 F) TEMPORAL 65 16 98 %   04/02/17 2045 149/75 - - 63 17 90 %   04/02/17 2040 - - - 65 21 92 %   04/02/17 2035 150/76 - - 65 23 93 %   04/02/17 2030 144/63 - - 66 22 94 %   04/02/17 2025 161/83 - - 71 20 95 %   04/02/17 2015 155/84 - - 66 21 95 %   04/02/17 2000 158/88 36.5 C (97.7 F) TEMPORAL 65 21 95 %   04/02/17 1945 161/85 - - 65 24 94 %   04/02/17 1930 164/89 - - 66 24 100 %   04/02/17 1915 (!) 146/93 - - 68 23 100 %   04/02/17 1900 157/83 36.2 C (97.2 F) TEMPORAL 66 23 100 %   04/02/17 1845 159/82 - - 75 23 100 %   04/02/17 1830 (!) 146/91 - - 94 23 100 %   04/02/17 1818 153/86 36.2 C (97.2 F) TEMPORAL 94 20 100 %     GEN:  NAD, appears comfortable with HOB elevated, suction and ice packs bedside.  HEENT:   H: Moderate midfacial edema consistent with procedure. Hypoesthesia of V2,3 consistent with the procedure.   E: EOMI, no visual distubances, denies blurry or double vision, no periorbital edema orecchymosis    E: Hearing grossly intact  N: Nose at midline, nares patent, but dried heme appreciable to nares b/l.   T: moderate midfacial swelling consistent with procedure. Maxillary mucosa pink and well perfused with <2sec cap refill. Maxillary vestibular incision well approximated and hemostatic. Suture presents, secure, c/d/Donaldson. Maxillary and mandibular orthodontic brackets and arch wires, present and secure. Splint in place and secure. Heavy guiding MMF elastics present and secured. Maxillary and  mandibular dental midlines appears grossly on with facial midline. Hypoesthesia to maxillary labia as expected with procedure at this time.   HEART: RRR  PULM: non-labored respirations, essentially CTAB  ABD: soft, non-distended, non-tender  Neuro: AAOx3, no focal deficits  Extremities: atraumatic, wwp, no edema.    ASSESSMENT  Francisco BeachJoshua Donaldson is a 27 y.o. male with no reported PMH  who is now POD 0 from from three piece [place] Donaldson osteotomy, Francisco Donaldson, and genioplasty. Pt is doing well post operatively. Pt is scheduled for post op imaging later tonight.     PLAN   Cardiovascular: No concerns.    Pulmonary: On room air with humidified face tent in place.    GI: Antiemetics prn, bowel regimen prn   Fluids/Electrolytes: mIVF: LR , encourage fluid intake. Replete electrolytes prn   Diet: Diet full liquid   ID/Abx: IV Ancef q8h, Keflex at discharge for total of 7 days   Analgesia: PO Tylenol.  PRN oxycodone. .   Activity:  OOB/Ambulate, encourage.   Dispo: Inpatient, admitted to Dr. Tyron RussellFantuzzo    Please page [facility] resident on call at [phone] [facility] with any questions or concerns.

## 2017-04-02 NOTE — Discharge Instructions (Signed)
Oral and Maxillofacial Surgery Orthognathic Surgery  Discharge Instructions      Date: 04/02/2017  Diagnosis: Dentofacial Deformity  Attending Surgeon: Dr. Marlou Starks      After receiving sedative and/or general anesthetic medications you may be drowsy for up to 24hrs.  DO NOT drive or operate heavy machinery for 24hrs, DO NOT drink alcoholic beverages for 58NID, DO NOT make major decisions, sign contracts, etc. for 24hrs.    DO NOT drive, operate heavy machinery, drink alcoholic beverages or make major decisions or sign important documents while taking narcotic pain medications.     Diet:  Full liquids    Dressing or Incision care: Ice to area for comfort    Activity: Use ice pack x 2days then switch to heat if needed for muscle soreness, rest. No strenuous activity.    Bathing/showering: May shower and bathe.    Med Rec: As prescribed    Follow up: Call for an appointment with Dr. Marlou Starks in 1 week    May return to work/school 10 days    Call your doctor if: Uncontrolled pain, Fever of 101 F. or greater, Vomiting, Nausea or Diarrhea or uncontrolled bleeding              These postoperative instructions are to be used as a guideline to help make your  postoperative course uneventful and answer questions you or your caregivers might have.  You have just had a major surgery performed and not all patients respond the same. If you have questions that these instructions do no cover, please call the office to speak to one of our surgeons. If you think you are having a complication please call immediately.    Postoperative Visits:   After discharge from the hospital a postoperative visit should scheduled usually within two to four days for a visit to the office one week after surgery.    Diet:   Immediately after surgery you will be on a liquid diet. This can be advanced to a  pureed diet (puddings, applesauce, oatmeal, or anything blenderized) usually by the third or fourth postoperative days.   It is extremely  important to maintain a healthy diet. A high calorie, high protein diet is  recommended, with frequent meals and snacks. Nutritional supplements (ensure and  carnation instant breakfast) are an excellent addition to your diet.    Activity:   Immediately after surgery you will feel lethargic. This is normal and is anticipated after any general anesthesia and major surgery where you may have significant blood loss. If you have had upper and lower jaw surgery performed it will take approximately two weeks to feel your strength returning toward normal. If you have had upper or lower jaw surgery performed you will feel your strength returning toward normal usually by the end of the first week. Your activity for the first week should be limited to normal non-strenuous activates. If you are requiring narcotic pain medications for control of discomfort you should not be driving a car or working with any dangerous equipment. The best rule of thumb is to listen to your body: when you feel good perform activities and when you feel tired then rest.    Medication:   Upon discharge from the hospital you will usually be given prescriptions for an antibiotic, antiinflammatory, and a narcotic pain medicine. Please use these as instructed! If you have questions about the medicines or have a reaction to one the medicines or have a reaction to one the medicines please call  the office.    Oral Hygiene:   It is important to maintain excellent oral hygiene. After any meal, even full liquids; please rinse your mouth with warm salt water (1/4 tsp salt to six oz. Of water). Once you get home from the hospital you can start brushing your teeth. Use a soft pediatric toothbrush and only brush the teeth and braces. At first you probably will not be able to open wide enough to brush the tops or inside portion of your teeth.     Incisions:   If you had lower jaw surgery you will have two small incisions at the angle of your jaw.  If you had upper  jaw surgery you will have an incision under your upper lip.  Clean with gentle rinsing of salt water.    Lips:   Your lips and surrounding skin may start to peel. Avoid peeling the dead skin as it may be attached to deeper tissues. You can gently remove the dead skin if it rubs off after soaking the area with a warm washcloth. Keep your lips moist with lip balm (Chapstick, Blistex and Carmex).    Elastics:   You may have guiding elastics attached to your braces. These are important to help retrain the movement of your jaws. You will not hurt anything if you try to open though it will probably feel tight. If your elastics break on one side, remove the elastics on the opposite side to prevent uneven pulling and call your surgeon. Usually you can wait until office hours for these to be replaced. After the first week you will be taught how to place the elastics yourself and can begin removing the elastics at mealtime to make eating easier and replace when they break.    Swelling:   Every patient will experience some degree of swelling. Ice placed on the cheeks will aid in keeping the swelling down. Ice is used for the first 48 hours and then moist heat can be used. Sometimes even after 48 hours the ice packs will feel good on your cheeks and you can alternate hot and cold packs.    Numbness:   Nearly all patients will experience numbness. This resolves differently for each person. Sometimes it may take months for the feeling in your lips to begin to return.    Remember, each person responds differently to surgery and you may have special  circumstances that may necessitate deviation from our guidelines. Your surgeon will  discuss any modifications of your postoperative course but should you have any  questions of concerns please call the office for clarification.      Orthognathic Tips    1. BRUSH, BRUSH, BRUSH YOUR TEETH! Although you just had surgery the incisions are far away from the teeth. If you do not keep them  clean you may get an infection. Brush at least 4-5 times per day with a soft bristle toothbrush.  2. Diet - Liquid (milkshakes, smoothies, Gatorade) for one week and then transition to mush for about 6 weeks (oatmeal, yogurt, pudding, cream of wheat, soft pasta, fish, etc.). You will have some syringes and tubing to help get liquid around the elastics for the first week.   3. KEEP DRINKING! Dehydration is the most common reason to have to come back into the hospital. Drink at least 2 liters of fluid per day.  4. Elastics will pop off occasionally. See if you can GENTLEY put them back on in the same orientation. If not please  come in and have them put back on.     5. Pain control will be Percocet (oxycodone/Tylenol) when discharged for the first week and transitioned to pill or tablet medications once the elastics are loosened. Liquid Motrin can also be used safely in addition to the liquid Percocet, alternating every 3-4 hours with medications.   6. Please maintain dressings as instructed and utilize ice on your face and cheeks as much as possible in the first 48 hours.   7. Take showers, walk as much as tolerated - It will make you feel better.   8. Get up and move around as much as possible to reduce swelling.   9. NO heavy lifting - anything greater than a gallon of milk.    10. NO strenuous exercising

## 2017-04-02 NOTE — Invasive Procedure Plan of Care (Signed)
Invasive Procedure Plan of Care (Consent Form 419):   Condition(s) Addressed: Jaw deformity and dysfunction   Performing Provider: Thayer DallasFANTUZZO, JOSEPH, and [facility] Residents and Staff   Side: Bilateral    Procedure: - Lefort 1 osteotomy in 3 pieces (saperation of upper jaw)  - Bilateral Sagittal Split Osteotomy of Mandible (separation of lower jaw)  - Septoplasty and Turbinectomy (realignigning of the inside of the nose)  - Genioplasty (reshaping of chin)   Special Equipment:    Planned Anesthesia: General   Benefits: Correction of upper and lower jaw relationship, improved chewing and speech, improved nasal breathing, balanced esthetics   Risks:   A. Facial and jaw swelling after surgery, usually lasting several days.    B. Bleeding, both during and after surgery, which may sometimes be severe enough to require blood transfusion.     C. Allergic reaction to any of the medications given during or after surgery.    D. Delayed healing of the bony segments; rarely requiring a second surgery and/or bone graft to repair.    E. Relapse: the tendency for the repositioned bone segments to return to their original position, which may require additional treatment, including surgery and/or bone grafting.    F. Bruising and discoloration of the skin around the jaws, eyes and nose.    G. Diminished sense of smell (if upper jaw surgery is done).    H. A change in cosmetic appearance.  Although this is primarily a procedure to restore jaw function, I am aware of some expected change in my appearance.  I understand that certain cosmetic changes may not be totally predictable.  There may also be changes in speech patterns which may require additional treatment.    I. Loss of feeling, pain or a tingling numbness in my chin, lips, tongue, gums, or teeth which occurs in a significant number of patients. These symptoms may last for several days, weeks or months.  I have been told that there is some chance that it may be permanent.     J. Possible decreased function of muscles of facial expression.    K. Scarring from external skin incisions if certain rigid fixation methods are used.    L. Possible need for additional procedures to remove fixation devices, pins, screws, plates or splints.    M. In certain cases where bone cuts may be made in the marrow space between teeth, there is the possibility of devitalization of those teeth which may require later root canal procedures, and may result in the loss of those teeth.    N. In upper jaw surgery, the sinus will be affected for several weeks, and there may be a need for further sinus surgery to remedy any lingering problems.    O. Post-operative infection which may cause loss of adjacent bone and/or teeth and which may require additional treatment for a prolonged period of time.    P. Change in position of the jaw joints (TMJ) which may cause post-operative discomfort, bite change and chewing difficulties.  If TMJ symptoms existed before surgery, there may be no improvement and even some worsening of these symptoms after surgery.    Q. Stretching of the corners of the mouth with resulting discomfort and slow healing.    R. Inflammation of veins (phlebitis) that are used for IV fluids and medications, sometimes resulting in pain, swelling, discoloration and restriction of arm or hand movement for some time after surgery.     Alternatives: Additional orthodontics, observation, No treatment  Expected Length of Stay: 1 day(s)     I, or a designated member of my surgical team, have discussed the planned procedure, including the potential for any transfusion of blood products or receipt of tissue as necessary, expected benefits, the potential complications and risks and possible alternatives and their benefits and risks with the patient or the patient's surrogate. In my opinion, the patent or the patient's surrogate understands the proposed procedure, its risks, benefits, and  alternatives.    Electronically signed by Olene Floss, DDS at 7:36 AM     Patient Consent:  I hereby give my consent and authorize Thayer Dallas, and [facility] Residents and Staff  (The list of possible assistants, all of whom are privileged to provide surgical services at the hospital, is available)  To treat the following: Jaw deformity and dysfunction  Procedure includes: - Lefort 1 osteotomy in 3 pieces (saperation of upper jaw)  - Bilateral Sagittal Split Osteotomy of Mandible (separation of lower jaw)  - Septoplasty and Turbinectomy (realignigning of the inside of the nose)  - Genioplasty (reshaping of chin)  Laterality: Bilateral  1 The care provider has explained my condition to me, the benefits of having the above treatment procedure, and alternate ways of treating my condition. I understand that no guarantees have been made to me about the result of the treatment. The alternatives to this procedure include: Additional orthodontics, observation, No treatment   2 The care provider has discussed with me the reasonably foreseeable risks of the treatment and that there may be undesirable results. The risks that are specifically related to this procedure include:   A. Facial and jaw swelling after surgery, usually lasting several days.    B. Bleeding, both during and after surgery, which may sometimes be severe enough to require blood transfusion.     C. Allergic reaction to any of the medications given during or after surgery.    D. Delayed healing of the bony segments; rarely requiring a second surgery and/or bone graft to repair.    E. Relapse: the tendency for the repositioned bone segments to return to their original position, which may require additional treatment, including surgery and/or bone grafting.    F. Bruising and discoloration of the skin around the jaws, eyes and nose.    G. Diminished sense of smell (if upper jaw surgery is done).    H. A change in cosmetic appearance.  Although this is  primarily a procedure to restore jaw function, I am aware of some expected change in my appearance.  I understand that certain cosmetic changes may not be totally predictable.  There may also be changes in speech patterns which may require additional treatment.    I. Loss of feeling, pain or a tingling numbness in my chin, lips, tongue, gums, or teeth which occurs in a significant number of patients. These symptoms may last for several days, weeks or months.  I have been told that there is some chance that it may be permanent.    J. Possible decreased function of muscles of facial expression.    K. Scarring from external skin incisions if certain rigid fixation methods are used.    L. Possible need for additional procedures to remove fixation devices, pins, screws, plates or splints.    M. In certain cases where bone cuts may be made in the marrow space between teeth, there is the possibility of devitalization of those teeth which may require later root canal procedures, and may result in the  loss of those teeth.    N. In upper jaw surgery, the sinus will be affected for several weeks, and there may be a need for further sinus surgery to remedy any lingering problems.    O. Post-operative infection which may cause loss of adjacent bone and/or teeth and which may require additional treatment for a prolonged period of time.    P. Change in position of the jaw joints (TMJ) which may cause post-operative discomfort, bite change and chewing difficulties.  If TMJ symptoms existed before surgery, there may be no improvement and even some worsening of these symptoms after surgery.    Q. Stretching of the corners of the mouth with resulting discomfort and slow healing.    R. Inflammation of veins (phlebitis) that are used for IV fluids and medications, sometimes resulting in pain, swelling, discoloration and restriction of arm or hand movement for some time after surgery.     3 I understand that during the treatment a  condition may be discovered which was not known before the treatment started. Therefore, I authorize the care provider to perform any additional or different treatment which is thought necessary and available.   4 Any tissue, parts, or substances removed during the procedure may be retained or disposed of in accordance with customary scientific, educational and clinical practice.   5 Vendor information if appropriate: If a vendor representative is expected to be present during my procedure, it has been explained to me that the vendor representative works for [employer] (manufacturer of the device to be used) and that his/her role includes helping the OR staff prepare and providing information and support to hospital staff regarding the device. I consent to the vendor representative's presence and involvement as described. If circumstances change and a decision is made during my procedure that a vendor representative's presence is needed, I will be notified of the above after my procedure is completed.  Equipment:    6 If blood products are needed, I would agree: Yes   7 If tissue products are needed, I would agree: Yes    8 Blood/Tissue use limitations and/or exclusions:       I have carefully read and fully understand this informed consent form, and have had sufficient opportunity to discuss my condition and the above procedure(s) with the care provider and his/her associates, and all of my questions have been answered to my satisfaction. I understand that my surgeon/provider performing the procedure may not be physically present in the operating/procedure room the entire time that I am there. My surgeon/provider has answered my questions regarding this and how it may relate to my surgery/procedure. I agree to the Plan of Care as outlined above.     With respect to receiving blood and/or blood products, I AGREE TO BLOOD TRANSFUSION except where limitations and/or exclusions have been noted above. I have had a chance  to discuss the risks, benefits and alternatives regarding transfusion and/or receipt of tissues (as above) with my healthcare provider. My decision(s) regarding the transfusion of blood or blood components and/or the receipt of tissue are as above. I understand this covers my perioperative/periprocedural (before, during, and after the surgery/procedure) course of treatment and my entire hospitalization.  I understand that if my condition changes significantly during my postoperative course that my physicians will discuss the risks and benefits of transfusion with me again at that time. I may be asked to provide a secondary consent to receive or refuse blood transfusion.  Patient Signature   (or Parent/Legal Guardian if pt is unable to sign or is a minor)  Date/Time     Electronic Signatures will display at the bottom of the consent form.    Your doctor or someone your doctor has appointed has told you that you may need blood or a blood product transfusion, which has been collected from volunteers, as part of your treatment as a patient.    The reasons you might need blood or blood products include, but are not limited to:    Significant loss of your own blood   Your body may not be getting enough oxygen to its tissues    Treatment of bleeding disorders caused by low platelet counts or platelets that do not work right (platelets are part of a cell that helps to form clots and keeps you from bleeding too much).   You may not have enough of other substances that help your blood clot or stop you from bleeding more  The risks of getting a transfusion of blood or blood products include, but are not limited to:    Damage to the lungs   Difficulty breathing due to fluid in the lungs   The product may contain bacteria or rarely a virus (which includes HIV and Hepatitis)   Blood from the community blood supply has been collected from volunteer donors who have been screened for health risk. The blood  has been tested for major blood transmitted disease, but no transfusion is 100% safe. The blood is tested with very sensitive and accurate tests to screen for hepatitis, AIDS, and other disease, which makes the risks very small.   You may have side effects from the transfusion (rash, fever, chills) or an allergic reaction   The transfusion increases your risks of getting infection or cancer coming back   The transfusion can increase the time you have to stay in the hospital   The transfusion can potentially cause death if the wrong blood is given or your body rejects the blood   Before blood is transfused, it is tested again to make sure it is the correct type  There are other options than getting blood or blood products from other people and they include:    Drugs which can decrease bleeding   Drugs which can cause your body to make more blood (used in elective procedures with advance notice)   Autologous (your own blood) donation (needs pre-arrangement)   No transfusion  If you exercise your right to refuse to be transfused with blood or blood products; these things listed below, among others, could happen to you:   Your body may not get enough oxygen and suffer damage   You may have a higher chance of bleeding   You may limit other options for your condition   You may die from losing too much blood

## 2017-04-02 NOTE — Progress Notes (Signed)
Patient denies any pain or any nausea at this time. Continues to ooze serosanguinous with saliva from mouth moderate amount. Patient still sleepy but arousals at times. Patient meets PACU discharge criteria. Patient transfer to 23 hour unit via stretcher accompanied by transport team. Report given to Elisabeth CaraLyndsey Hunt RN.

## 2017-04-03 ENCOUNTER — Inpatient Hospital Stay: Payer: PRIVATE HEALTH INSURANCE

## 2017-04-03 DIAGNOSIS — Z4889 Encounter for other specified surgical aftercare: Secondary | ICD-10-CM

## 2017-04-03 MED ORDER — ACETAMINOPHEN 650 MG/20.3 ML WRAPPED *I*
Status: AC
Start: 2017-04-03 — End: 2017-04-03
  Filled 2017-04-03: qty 40.6

## 2017-04-03 MED ORDER — CEFAZOLIN 2000 MG IN STERILE WATER 20ML SYRINGE *I*
PREFILLED_SYRINGE | INTRAVENOUS | Status: AC
Start: 2017-04-03 — End: 2017-04-03
  Administered 2017-04-03: 2000 mg via INTRAVENOUS
  Filled 2017-04-03: qty 20

## 2017-04-03 MED ORDER — CHLORHEXIDINE GLUCONATE 0.12 % MT SOLN *I*
OROMUCOSAL | Status: AC
Start: 2017-04-03 — End: 2017-04-03
  Filled 2017-04-03: qty 15

## 2017-04-03 MED ORDER — KETOROLAC TROMETHAMINE 30 MG/ML IJ SOLN *I*
INTRAMUSCULAR | Status: DC
Start: 2017-04-03 — End: 2017-04-03
  Filled 2017-04-03: qty 1

## 2017-04-03 MED ORDER — OXYMETAZOLINE HCL 0.05 % NA SOLN *I*
2.0000 | Freq: Once | NASAL | Status: AC
Start: 2017-04-03 — End: 2017-04-03
  Administered 2017-04-03: 2 via NASAL

## 2017-04-03 MED ORDER — ACETAMINOPHEN 650 MG/20.3 ML WRAPPED *I*
Status: AC
Start: 2017-04-03 — End: 2017-04-03
  Filled 2017-04-03: qty 20.3

## 2017-04-03 MED ORDER — DEXAMETHASONE SODIUM PHOSPHATE 4 MG/ML INJ SOLN *WRAPPED*
INTRAMUSCULAR | Status: AC
Start: 2017-04-03 — End: 2017-04-03
  Filled 2017-04-03: qty 2

## 2017-04-03 MED ORDER — KETOROLAC TROMETHAMINE 30 MG/ML IJ SOLN *I*
INTRAMUSCULAR | Status: AC
Start: 2017-04-03 — End: 2017-04-03
  Filled 2017-04-03: qty 1

## 2017-04-03 NOTE — Plan of Care (Signed)
Poor po intake, continues to bleed via nose and mouth and mouth numb.  Face very edematous especially on right side. Head elevated to 60 degrees, ice collar on, O2 sats good on humidified RA.  LR at 100cc/hr infusing.  Voided once this shift- large amt amber urine.  Airway noisy through nose when up walking, states he felt restricted- rest encouraged, breathing slowed and sats ok on return.  Contacted [facility] with concerns- extra dose of afrin ordered.  See flows for full assessment.  Mobility     Patient's functional status is maintained or improved Maintaining        Nutrition     Patient's nutritional status is maintained or improved Maintaining        Pain/Comfort     Patient's pain or discomfort is manageable Maintaining

## 2017-04-03 NOTE — Plan of Care (Signed)
[facility] Patient arrived to unit via stretcher, Patient lethargic but able to walk from stretcher to bed with minimal assistance. Facial swelling, greater on right cheek then left cheek. Moderate serosanguinous drainage from mouth and nose with cotton packing in right nostril and left nostril open. Mouth rinsed and suctioned to see incisions on lower gum line, some swelling but intact. Sutures on both cheeks CDI as well. Around 0000 patient more awake and able to take tylenol and tolerated clears. Much encouragement to take in fluids. Teaching provided in using syringes for PO intake. Patient voided 800ml of clear/yellow urine. Pain controlled with tylenol and Toradol, rating between 3-5/10 overnight. HOB 30 and ice applied to help with swelling. LR at 17300ml/hr and patient tolerating face tent with humidified RA. X-ray completed. See flow sheet for detailed assessment     Mobility     Patient's functional status is maintained or improved Maintaining        Pain/Comfort     Patient's pain or discomfort is manageable Maintaining          Nutrition     Patient's nutritional status is maintained or improved Progressing towards goal

## 2017-04-03 NOTE — Progress Notes (Signed)
Oral and Maxillofacial Surgery   Progress Note      Patient: Francisco Donaldson    LOS: 0 days    Attending: Dr Marlou Starks      SUBJECTIVE  Reports that pain is well controlled. Patient is ambulating and voiding spontaneously and tolerating PO intake of full liquid well. Denies fever, chills, nausea, vomiting, chest pain, shortness of breath. Staff reports of minimal oozing overnight from right nares and mouth.     INTERVAL HISTORY  [name] is POD#1 from three piece [facility] I osteotomy, BSSO, and genioplasty     OBJECTIVE  Physical Exam:  Temp:  [36 C (96.8 F)-36.7 C (98.1 F)] 36.7 C (98.1 F)  Heart Rate:  [61-94] 71  Resp:  [16-24] 16  BP: (121-164)/(63-93) 152/70  FiO2:  [28 %-40 %] 28 %     GEN:  NAD, appears comfortable  HEENT:   H: Moderate midfacial edema consistent with the procedure, more exaggerated on the right side and somewhat firm. Hypoesthesia of V2,3 consistent with the procedure. Humidified air tent over the face.   E: EOMI, PERRL, no visual distubances, no blurry or double vision.   E: Hearing grossly intact  N: Nose at midline, dried heme appreciated from left nares. Minimal bleeding from the right nares; afrin soaked cotton roll in place.   T: Maxillary mucosa pink and well perfused with <2sec cap refill. Maxillary vestibular incision well approximated and hemostatic. Suture presents, secure, c/d/i. Maxillary and mandibular orthodontic brackets and arch wires, present and secure. Splint in place and secure. Heavy guiding MMF elastics present and secured. Maxillary and mandibular dental midlines appears grossly on with facial midline. Hypoesthesia to maxillary labia as expected with procedure at this time. Mild oozing appreciated intra orally. Suction bedside.   HEART: RRR  PULM: non-labored respirations, essentially CTAB  ABD: soft, non-distended, non-tender  Neuro: AAOx3, no focal deficits  Extremities: atraumatic, wwp, no edema      Intake/Output:  03/08 0700 - 03/09 0659  In:  2811.7 [P.O.:275; I.V.:2536.7]  Out: 1000 [Urine:800]     Medications:  Current Facility-Administered Medications   Medication    sodium chloride (OCEAN) nasal spray 2 spray    Lactated Ringers Infusion    ceFAZolin (ANCEF) syringe 2,000 mg    ketorolac (TORADOL) 30 mg/mL injection 30 mg    oxyCODONE (ROXICODONE) 5 MG/5ML solution 5 mg    Or    oxyCODONE (ROXICODONE) 5 MG/5ML solution 10 mg    dexamethasone (DECADRON) injection 8 mg    ondansetron (ZOFRAN) injection 4 mg    promethazine (PHENERGAN) injection 6.25 mg    HYDROmorphone (DILAUDID) injection 0.5 mg    acetaminophen (TYLENOL) 325 MG/10.15ML solution 650 mg    chlorhexidine (PERIDEX) 0.12 % solution 15 mL    oxymetazoline (AFRIN) 0.05 % nasal spray 2 spray         Laboratory values:   No results for input(s): WBC, HGB, HCT, PLT, INR, ESR, CRP in the last 72 hours.    No components found with this basename: APTT, PT No results for input(s): NA, K, CL, CO2, UN, CREAT, GLU, CA, MG, PO4 in the last 72 hours.      GLUCOSE: No results for input(s): PGLU in the last 72 hours.    Imaging:             Assessment:  Francisco Donaldson is a 27 y.o. male with no reported PMH  who is now POD 0 from from three piece [facility]  I osteotomy, BSSO, and genioplasty. Post op imaging reviewed; hardware in place and without complication. Pt is tolerating fluid intake well. Will continue to monitor today given pt poor PO intake and significant right face edema.     Plan:   Cardiovascular: No concerns.    Pulmonary: On room air, Humidified air tent in place.    GI: Antiemetics prn, bowel regimen prn   Fluids/Electrolytes: mIVF: LR , encourage fluid intake. Replete electrolytes prn   Diet: Diet full liquid   ID/Abx:  IV Ancef q8h, Keflex at discharge for total of 7 days   Analgesia: PO Tylenol, IV Toradol.  PRN Oxycodone..   Activity:  OOB/Ambulate, encourage.   Dispo: Admitted to Dr Marlou Starks.        Author: Fara Olden, DMD as of: 04/03/2017  at: 7:45  AM    Please page [facility] resident on call at [phone] [facility] [phone] with any questions or concerns.

## 2017-04-03 NOTE — Plan of Care (Signed)
Mobility    • Patient's functional status is maintained or improved Progressing towards goal        Nutrition    • Patient's nutritional status is maintained or improved Progressing towards goal        Pain/Comfort    • Patient's pain or discomfort is manageable Progressing towards goal        Safety    • Patient will remain free of falls Progressing towards goal

## 2017-04-03 NOTE — Progress Notes (Addendum)
Daylight saving time occurred on this day. Events and notes between 1:00AM and 3:00AM may be out of order in the chart, where possible documentation was adjusted accordingly.

## 2017-04-03 NOTE — Progress Notes (Signed)
Dr. Tyron RussellFantuzzo and 2 residents by on rounds.  Mouth irrigated and suctioned.  Lg amt sersang drainage from nose and mouth, numb lips and cheeks, edema in right face> left face.  Ice collar on, HOB up 60degrees, humidified face tent in place.  Nose very congested.  Face washed several times.  LR at 100cc/hr infusing.  Denies need to void,  States pain is controlled with scheduled toradol and tylenol- declines oxycodone x2, aware of availability.  [place] -will come here around 1 to see him.  Unable to drink much, using syringe but due to numb and swollen lips, most flowing back out again.  Understands he will be staying another night in the hospital.

## 2017-04-03 NOTE — Progress Notes (Signed)
Francisco Donaldson arrived on [address] at 1330.  Sheeted from stretcher to bed.     Vital signs stable.  Pain currently 5/10.      Oriented to call bell, room, unit, visiting hours, IPC, and incentive spirometer.     Four eyed skin assessment completed with ConAgra FoodsKristen Donaldson. Assessment findings include:   Not Applicable      Interventions: No. WOCN consulted: no.   Q2 Hour Turning/Repositioning Needed:No . Low Air Loss Ordered: No. Patient incontinent of urine: No; patient incontinent of stool: No.      Wound: yes. surgical incision Slight oozing- monitoring required Dressed with: OTA    Dressing Change order No;     Current activity listed as AAT; patient acknowledged understanding of current treatment plan.  Francisco BeachJoshua Sigal is currently resting with call bell in reach.    Discharge process explained.     Emeline GeneralKevin Matas Burrows, RN as of 04/03/2017 at 1:37 PM

## 2017-04-04 MED ORDER — IBUPROFEN 20 MG/ML PO SUSP *I*
800.0000 mg | Freq: Three times a day (TID) | ORAL | Status: DC
Start: 2017-04-04 — End: 2017-04-05
  Administered 2017-04-04 – 2017-04-05 (×4): 800 mg via ORAL
  Filled 2017-04-04 (×9): qty 40

## 2017-04-04 MED ORDER — WHITE PETROLATUM GEL *WRAPPED*
CUTANEOUS | Status: DC | PRN
Start: 2017-04-04 — End: 2017-04-05
  Filled 2017-04-04: qty 453.6

## 2017-04-04 NOTE — Progress Notes (Addendum)
Oral and Maxillofacial Surgery   Progress Note      Patient: Francisco Donaldson    LOS: 1 days    Attending: Dr Marlou Starks      SUBJECTIVE  Reports that pain is well controlled. Patient is ambulating minimally and voiding spontaneously at bedside urinal. Poor PO intake.  Denies fever, chills, nausea, vomiting, chest pain, shortness of breath. Reports difficulty breathing out of nose. Feels more swollen, however pain minimal. Does not have desire to drink.     INTERVAL HISTORY  Francisco Donaldson is POD#2  from three piece Francisco Donaldson I osteotomy, BSSO, and genioplasty     OBJECTIVE  Physical Exam:  Temp:  [36.5 C (97.7 F)-37.1 C (98.8 F)] 37.1 C (98.8 F)  Heart Rate:  [56-88] 56  Resp:  [16-20] 20  BP: (138-161)/(60-78) 160/73     GEN:  NAD, very swollen, mouth breathing,   HEENT:   H:Significant midfacial edema bilaterally, more exaggerated on the right side and somewhat firm. Hypoesthesia of V2,3 consistent with the procedure. Humidified air tent over the face. Significant dried heme of upper lip and commissures.    E: EOMI, PERRL, no visual distubances, no blurry or double vision. Right eye swollen shut.   E: Hearing grossly intact  N: Nose at midline, thick tenacious dried heme from both nostrils. Congested.  No current bleeding  T: Maxillary mucosa pink and well perfused with <2sec cap refill. Maxillary vestibular incision well approximated and hemostatic. Suture presents, secure, c/d/i. Maxillary and mandibular orthodontic brackets and arch wires, present and secure. Splint in place and secure. Heavy guiding MMF elastics present and secured. Maxillary and mandibular dental midlines appears grossly on with facial midline. Hypoesthesia to maxillary labia as expected with procedure at this time. Mild oozing appreciated intra orally. Suction bedside. Dried heme on brackets  HEART: RRR  PULM: non-labored respirations, essentially CTAB  ABD: soft, non-distended, non-tender  Neuro: AAOx3, no focal  deficits  Extremities: atraumatic, wwp, no edema      Intake/Output:  03/09 0700 - 03/10 0659  In: 1473.3 [P.O.:100; I.V.:1373.3]  Out: 1250 [Urine:1250]     Medications:  Current Facility-Administered Medications   Medication    sodium chloride (OCEAN) nasal spray 2 spray    Lactated Ringers Infusion    ceFAZolin (ANCEF) syringe 2,000 mg    ketorolac (TORADOL) 30 mg/mL injection 30 mg    oxyCODONE (ROXICODONE) 5 MG/5ML solution 5 mg    Or    oxyCODONE (ROXICODONE) 5 MG/5ML solution 10 mg    dexamethasone (DECADRON) injection 8 mg    ondansetron (ZOFRAN) injection 4 mg    promethazine (PHENERGAN) injection 6.25 mg    HYDROmorphone (DILAUDID) injection 0.5 mg    acetaminophen (TYLENOL) 325 MG/10.15ML solution 650 mg    chlorhexidine (PERIDEX) 0.12 % solution 15 mL    oxymetazoline (AFRIN) 0.05 % nasal spray 2 spray         Laboratory values:   No results for input(s): WBC, HGB, HCT, PLT, INR, ESR, CRP in the last 72 hours.    No components found with this basename: APTT, PT No results for input(s): NA, K, CL, CO2, UN, CREAT, GLU, CA, MG, PO4 in the last 72 hours.      GLUCOSE: No results for input(s): PGLU in the last 72 hours.    Imaging:             Assessment:  Francisco Donaldson is a 27 y.o. male with no reported PMH  who is now  POD 0 from from three piece [facility] I osteotomy, BSSO, and genioplasty. Post op imaging reviewed; hardware in place and without complication. Patient needs to improve P.O intake, and increase ambulation prior to discharge. Facial edema seems to have peaked.     Plan:   Cardiovascular: No concerns.    Pulmonary: On room air, Humidified air tent in place.    GI: Antiemetics prn, bowel regimen prn   Fluids/Electrolytes: mIVF: LR , encourage fluid intake. Replete electrolytes prn   Diet: Diet full liquid   ID/Abx:  IV Ancef q8h, Keflex at discharge for total of 7 days   Analgesia: PO Tylenol, IV Toradol.  PRN Oxycodone..   Activity:  OOB/Ambulate, encourage.   Vaseline to  lips and Saline nasal spray Q2hrs. Afrin BID   Dispo: Admitted to Dr Marlou Starks. Discharge tomorrow       Author: Tonita Phoenix, DDS as of: 04/04/2017  at: 10:18 AM    Please page OMFS resident on call at [phone] [facility] with any questions or concerns.

## 2017-04-04 NOTE — Anesthesia Postprocedure Evaluation (Signed)
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] Surgeon(s):  Thayer DallasFantuzzo, Joseph, MD  Olene FlossBurchfield, Charles, DDS  Vivianne MasterLookabaugh, Lujean AmelSarah Ashley, MD Attending Anesthesiologist:  Izell CarolinaKHAN, AHMED R, MD         Recovery Vitals  BP: 140/64 (04/04/2017  3:58 PM)  Heart Rate: 61 (04/04/2017  3:58 PM)  Heart Rate (via Pulse Ox): 65 (04/03/2017 12:00 PM)  Resp: 16 (04/04/2017  3:58 PM)  Temp: 36.9 C (98.4 F) (04/04/2017  3:58 PM)  SpO2: 95 % (04/04/2017  3:58 PM)  FiO2: 21 % (04/03/2017  8:05 AM)  O2 Flow Rate: 0 L/min (04/03/2017  8:05 AM)   0-10 Scale: 1 (04/04/2017  5:52 PM)  Anesthesia type:  General  Complications Noted During Procedure or in PACU:  None   Comment:    Patient Location:  [facility] Floor  Level of Consciousness:    Recovered to baseline  Temperature Status:    Normothermic  Oxygen Saturation:    Within patient's normal range  Cardiac Status:   Within patient's normal range  Fluid Status:    Stable  Airway Patency:     Yes  Pulmonary Status:    Baseline  Pain Management:    Adequate analgesia  Nausea and Vomiting:  None    Post Op Assessment:    Tolerated procedure well   Attending Attestation:  All indicated post anesthesia care provided     -

## 2017-04-04 NOTE — Addendum Note (Signed)
Addendum  created 04/04/17 1827 by Malena CatholicWissler, Alaiya Martindelcampo N, MD,PHD    Sign clinical note

## 2017-04-05 ENCOUNTER — Encounter: Payer: Self-pay | Admitting: Oral Surgery

## 2017-04-05 MED ORDER — IBUPROFEN 20 MG/ML PO SUSP *I*
800.0000 mg | Freq: Three times a day (TID) | ORAL | 0 refills | Status: AC
Start: 2017-04-05 — End: ?

## 2017-04-05 MED ORDER — OXYMETAZOLINE HCL 0.05 % NA SOLN *I*
2.0000 | Freq: Two times a day (BID) | NASAL | 0 refills | Status: AC
Start: 2017-04-05 — End: ?

## 2017-04-05 MED ORDER — OXYCODONE HCL 5 MG/5ML PO SOLN *I*
5.0000 mg | Freq: Four times a day (QID) | ORAL | 0 refills | Status: AC | PRN
Start: 2017-04-05 — End: ?

## 2017-04-05 MED ORDER — AMOXICILLIN-POT CLAVULANATE 400-57 MG/5ML PO SUSR *I*
400.0000 mg | Freq: Two times a day (BID) | ORAL | 0 refills | Status: AC
Start: 2017-04-05 — End: 2017-04-12

## 2017-04-05 MED ORDER — SALINE NASAL SPRAY 0.65 % NA SOLN *WRAPPED*
2.0000 | NASAL | 1 refills | Status: AC | PRN
Start: 2017-04-05 — End: ?

## 2017-04-05 MED ORDER — ACETAMINOPHEN 325 MG/10.15 ML WRAPPED *I*
650.0000 mg | Freq: Four times a day (QID) | 1 refills | Status: AC
Start: 2017-04-05 — End: ?

## 2017-04-05 NOTE — Progress Notes (Signed)
[facility]  PH[facility]ACY FORM     Todays date:  April 05, 2017    Patient Name: Francisco BeachJoshua Sylvester      Medical Record #: [id number]   DOB: [birth year]  Patients Address: [address], [address]                Social Worker: Faythe GheeStephanie Sydne Krahl       Date of Service: April 05, 2017       Funding Source: SW Medication Assistance Fund     Patient in need of funding assistance at time of discharge. Patient does not need have any funds available and medications are needed for safe discharge.  ___________________________________________________________________    Pharmacy Information:  Date/time sent: April 05, 2017     Time needed: when ready    Patient Location: Inpatient (specify unit)  [phone]    Medication Pick-up Preference: Please call ext. [phone] when ready    Pharmacy Contact: Shawn  Social work signature: Market researchertephanie Phoebie Shad  Supervisor/Manager Approval (if indicated):  Date:  Museum/gallery exhibitions officer(supervisor signature not required for Medicaid pending)

## 2017-04-05 NOTE — Progress Notes (Signed)
Reviewed 1082 and AVS with patient. No questions or concerns voiced. Verbalized understanding of all. All paperwork signed by patient, and was given a copy.     Patient dressed appropriately for the weather and discharged without issue.     Accompanied by: Aunt Sheilah PigeonRobin Carbonneau   Mode of transportation: wheelchair to car, discharge to home.       Signed by: Danton SewerAllison R Van Buskirk, RN as of 04/05/2017 at 5:38 PM

## 2017-04-05 NOTE — Progress Notes (Signed)
Oral and Maxillofacial Surgery   Progress Note      Patient: Francisco Donaldson    LOS: 2 days    Attending: Dr Marlou Starks      SUBJECTIVE  Pain well controlled. Slept well overnight. Taking in better P.O with syringe. Patient is ambulating and voiding without difficulty.   Denies fever, chills, nausea, vomiting, chest pain, shortness of breath. Reports continued difficulty breathing out of nose. Feels less swollen.  INTERVAL HISTORY  Alison Breeding is POD#3  from three piece Eddie Dibbles I osteotomy, BSSO, and genioplasty     OBJECTIVE  Physical Exam:  Temp:  [36.3 C (97.3 F)-37.2 C (99 F)] 36.8 C (98.2 F)  Heart Rate:  [53-65] 61  Resp:  [16] 16  BP: (140-144)/(63-81) 144/63     GEN:  NAD, less swollen, mouth breathing, awake and alert, taking medication with syringe  HEENT:   H:moderate midfacial edema bilaterally that is greatly improved, more exaggerated on the right side and with improved firmness. Hypoesthesia of V2,3 consistent with the procedure.  E: EOMI, PERRL, no visual distubances, no blurry or double vision. Right eye no longer swollen shut.   E: Hearing grossly intact  N: Nose at midline, dried heme right nostril. No current bleeding  T: Maxillary mucosa pink and well perfused with <2sec cap refill. Maxillary vestibular incision well approximated and hemostatic. Suture presents, secure, c/d/i. Maxillary and mandibular orthodontic brackets and arch wires, present and secure. Splint in place and secure. Heavy guiding MMF elastics present and secured. Maxillary and mandibular dental midlines appears grossly on with facial midline. Hypoesthesia to maxillary labia as expected with procedure at this time. Mild oozing appreciated intra orally. Suction bedside. Dried heme on brackets  HEART: RRR  PULM: non-labored respirations, essentially CTAB  ABD: soft, non-distended, non-tender  Neuro: AAOx3, no focal deficits  Extremities: atraumatic, wwp, no edema      Intake/Output:  03/10 0700 - 03/11 0659  In: 2560  [P.O.:360; I.V.:2200]  Out: 900 [Urine:900]     Medications:  Current Facility-Administered Medications   Medication    ibuprofen (ADVIL,MOTRIN) 100 MG/5ML suspension 800 mg    white petrolatum ointment    sodium chloride (OCEAN) nasal spray 2 spray    Lactated Ringers Infusion    ceFAZolin (ANCEF) syringe 2,000 mg    dexamethasone (DECADRON) injection 8 mg    ondansetron (ZOFRAN) injection 4 mg    promethazine (PHENERGAN) injection 6.25 mg    acetaminophen (TYLENOL) 325 MG/10.15ML solution 650 mg    chlorhexidine (PERIDEX) 0.12 % solution 15 mL    oxymetazoline (AFRIN) 0.05 % nasal spray 2 spray         Laboratory values:   No results for input(s): WBC, HGB, HCT, PLT, INR, ESR, CRP in the last 72 hours.    No components found with this basename: APTT, PT No results for input(s): NA, K, CL, CO2, UN, CREAT, GLU, CA, MG, PO4 in the last 72 hours.      GLUCOSE: No results for input(s): PGLU in the last 72 hours.    Imaging:             Assessment:  Thorn Demas is a 27 y.o. male with no reported PMH  who is now POD 3 from from three piece Eddie Dibbles I osteotomy, BSSO, and genioplasty. Post op imaging reviewed; hardware in place and without complication. Facial edema has greatly resolved in last 24 hours, and patient tolerating PO intake.     Plan:  Cardiovascular: No concerns.    Pulmonary: On room air   GI: Antiemetics prn, bowel regimen prn   Fluids/Electrolytes: mIVF: LR , encourage fluid intake. Replete electrolytes prn   Diet: Diet full liquid   ID/Abx:  IV Ancef q8h, Keflex at discharge for total of 7 days   Analgesia: PO Tylenol, IV Toradol.  PRN Oxycodone.   Activity:  OOB/Ambulate, encourage.   Vaseline to lips and Saline nasal spray Q2hrs. Afrin BID   Dispo: Admitted to Dr Marlou Starks. Possible discharge today       Author: Tonita Phoenix, DDS as of: 04/05/2017  at: 7:33 AM    Please page OMFS resident on call at [phone] [facility] with any questions or concerns.

## 2017-04-05 NOTE — Discharge Summary (Signed)
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 Patient was planned for elective 3 piece Lefort osteotomy , BSSO, and genioplasty  to correct these abnormalities. 3 piece lefort, BSSO, and genioplasty were performed on 04/02/2017 as planned without intraoperative complication. Patient admitted overnight for observation and found to have greater than average amount of facial swelling associated with the procedure, right sided facial hematoma, and difficulty taking adequate amounts of oral intake. Ivin BootyJoshua was then admitted as inpatient to continue IV antibiotics, IV Decadron, monitor facial swelling and to assure adequate P.O. Intake was performed. On day of discharge 04/05/2017 the patients swelling had improved greatly, adequate P.O. Intake had been achieved, pain was well controlled and patient was ambulating and voiding without difficulty.              XRAY RESULTS: Post operative panorex and ceph       SIGNIFICANT MED CHANGES: None        Signed: Milly Jakobrevor Wentworth Edelen, DDS  On: 04/05/2017  at: 7:54 AM

## 2017-04-05 NOTE — Plan of Care (Signed)
Problem: Pain/Comfort  Goal: Patient's pain or discomfort is manageable  Outcome: Completed or Resolved Date Met: 04/05/17      Problem: Nutrition  Goal: Patient's nutritional status is maintained or improved  Outcome: Completed or Resolved Date Met: 04/05/17      Problem: Mobility  Goal: Patient's functional status is maintained or improved  Outcome: Completed or Resolved Date Met: 04/05/17      Problem: Safety  Goal: Patient will remain free of falls  Outcome: Completed or Resolved Date Met: 04/05/17      Problem: Post-Operative Hemodynamic Stability  Goal: Maintain Hemodynamic Stability  Outcome: Completed or Resolved Date Met: 04/05/17      Problem: Post-Operative Complications  Goal: Prevent post-operative complications  Outcome: Completed or Resolved Date Met: 04/05/17    Goal: Patient will remain free from symptoms of infection-post op  Outcome: Completed or Resolved Date Met: 04/05/17

## 8386-09-27 DEATH — deceased
